# Patient Record
Sex: Female | Born: 1985 | Race: White | Hispanic: No | Smoking: Former smoker
Health system: Southern US, Community
[De-identification: ages and names within clinical notes are randomized; demographics above are authoritative.]

## PROBLEM LIST (undated history)

## (undated) DIAGNOSIS — F32A Depression, unspecified: Secondary | ICD-10-CM

## (undated) DIAGNOSIS — J45909 Unspecified asthma, uncomplicated: Secondary | ICD-10-CM

## (undated) DIAGNOSIS — R112 Nausea with vomiting, unspecified: Secondary | ICD-10-CM

## (undated) DIAGNOSIS — Z9889 Other specified postprocedural states: Secondary | ICD-10-CM

## (undated) DIAGNOSIS — F419 Anxiety disorder, unspecified: Secondary | ICD-10-CM

## (undated) DIAGNOSIS — G43909 Migraine, unspecified, not intractable, without status migrainosus: Secondary | ICD-10-CM

## (undated) DIAGNOSIS — F329 Major depressive disorder, single episode, unspecified: Secondary | ICD-10-CM

## (undated) HISTORY — DX: Other specified postprocedural states: Z98.890

## (undated) HISTORY — DX: Depression, unspecified: F32.A

## (undated) HISTORY — PX: NO PAST SURGERIES: SHX2092

## (undated) HISTORY — PX: TUBAL LIGATION: SHX77

## (undated) HISTORY — DX: Anxiety disorder, unspecified: F41.9

## (undated) HISTORY — DX: Major depressive disorder, single episode, unspecified: F32.9

## (undated) HISTORY — DX: Nausea with vomiting, unspecified: R11.2

---
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
## 2010-07-19 ENCOUNTER — Encounter: Admission: RE | Admit: 2010-07-19 | Discharge: 2010-07-19 | Payer: Self-pay | Admitting: Family Medicine

## 2012-04-11 ENCOUNTER — Other Ambulatory Visit: Payer: Self-pay | Admitting: Neurosurgery

## 2012-04-11 DIAGNOSIS — M542 Cervicalgia: Secondary | ICD-10-CM

## 2012-04-11 DIAGNOSIS — M545 Low back pain: Secondary | ICD-10-CM

## 2012-04-17 ENCOUNTER — Ambulatory Visit
Admission: RE | Admit: 2012-04-17 | Discharge: 2012-04-17 | Disposition: A | Discharge status: Home / Self Care | Payer: BC Managed Care – PPO | Source: Ambulatory Visit | Attending: Neurosurgery | Admitting: Neurosurgery

## 2012-04-17 DIAGNOSIS — M545 Low back pain: Secondary | ICD-10-CM

## 2012-04-17 DIAGNOSIS — M542 Cervicalgia: Secondary | ICD-10-CM

## 2012-05-23 ENCOUNTER — Inpatient Hospital Stay (HOSPITAL_COMMUNITY)
Admission: AD | Admit: 2012-05-23 | Discharge: 2012-05-23 | Disposition: A | Discharge status: Home / Self Care | Payer: Medicaid Other | Source: Ambulatory Visit | Attending: Obstetrics and Gynecology | Admitting: Obstetrics and Gynecology

## 2012-05-23 ENCOUNTER — Other Ambulatory Visit: Payer: Self-pay | Admitting: Obstetrics & Gynecology

## 2012-05-23 DIAGNOSIS — Z298 Encounter for other specified prophylactic measures: Secondary | ICD-10-CM | POA: Insufficient documentation

## 2012-05-23 DIAGNOSIS — Z3480 Encounter for supervision of other normal pregnancy, unspecified trimester: Secondary | ICD-10-CM | POA: Insufficient documentation

## 2012-05-23 DIAGNOSIS — Z2989 Encounter for other specified prophylactic measures: Secondary | ICD-10-CM | POA: Insufficient documentation

## 2012-05-23 MED ORDER — RHO D IMMUNE GLOBULIN 1500 UNIT/2ML IJ SOLN
300.0000 ug | Freq: Once | INTRAMUSCULAR | Status: AC
  Administered 2012-05-23: 300 ug via INTRAMUSCULAR
  Filled 2012-05-23: qty 2

## 2012-05-23 NOTE — MAU Note (Signed)
I spoke with Dr. Juliene Pina regarding orders for this patient who present for Rhophylac and Ultrasound after talking via phone with Dr. Juliene Pina yesterday.  Dr. Juliene Pina states pt only needs Rhophylac and that her BHCG had increased.  Pt will move her care to Dr. Senaida Ores on 05/27/2012 as arranged by Dr. Juliene Pina.  Pt voices she was told she needed [place], 2nd phone call to Dr. Juliene Pina and same plan.  I explained again to pt the significance of increase BHCG as stated by Dr. Juliene Pina.  Pt offered to register into MAU for further exam, however this did not guarantee [place] medically necessary, this would be discussed after evaluation by MAU provider since she would not be registered under Dr. Juliene Pina.  Repeatly told patient she was welcomed to be evaluated in MAU.  Pt elected to go with only Rhophylac per Dr. Juliene Pina plan and will return when injection ready. Phone Number [phone].  Left with husband and child.

## 2012-05-23 NOTE — MAU Note (Signed)
Rhophylac information sheet given to the patient. Will call the office for the order. Patient states she is to have an ultrasound per Dr. Juliene Pina. Explained the 1 1/2 hours required to process this order. Patient understands.

## 2012-05-24 LAB — RH IG WORKUP (INCLUDES ABO/RH)
ABO/RH(D): O NEG
Antibody Screen: NEGATIVE
Gestational Age(Wks): 5
Unit division: 0

## 2012-05-26 LAB — ABO/RH: ABO/RH(D): O NEG

## 2012-05-30 ENCOUNTER — Encounter (HOSPITAL_COMMUNITY): Payer: Self-pay | Admitting: *Deleted

## 2012-05-30 ENCOUNTER — Emergency Department (HOSPITAL_COMMUNITY)
Admission: EM | Admit: 2012-05-30 | Discharge: 2012-05-31 | Disposition: A | Discharge status: Home / Self Care | Payer: Medicaid Other | Attending: Emergency Medicine | Admitting: Emergency Medicine

## 2012-05-30 DIAGNOSIS — Z880 Allergy status to penicillin: Secondary | ICD-10-CM | POA: Insufficient documentation

## 2012-05-30 DIAGNOSIS — R071 Chest pain on breathing: Secondary | ICD-10-CM | POA: Insufficient documentation

## 2012-05-30 DIAGNOSIS — Z79899 Other long term (current) drug therapy: Secondary | ICD-10-CM | POA: Insufficient documentation

## 2012-05-30 DIAGNOSIS — R0789 Other chest pain: Secondary | ICD-10-CM

## 2012-05-30 DIAGNOSIS — O99891 Other specified diseases and conditions complicating pregnancy: Secondary | ICD-10-CM | POA: Insufficient documentation

## 2012-05-30 NOTE — ED Notes (Signed)
Pt states she was leaning over her baby's crib and put a lot of pressure on her R rib. Since then, she has been in a lot of pain.  Tonight, she sneezed, around 5 pm and she heard a pop.  Since then she experiencing sob on inspiration and great pain.

## 2012-05-30 NOTE — ED Notes (Signed)
Prior to registration: pt reports "is pregnant and feels like she separated some ribs", c/o "rib pain, worse with movement and inspiration".

## 2012-05-31 ENCOUNTER — Encounter (HOSPITAL_COMMUNITY): Payer: Self-pay

## 2012-05-31 MED ORDER — ACETAMINOPHEN 325 MG PO TABS
650.0000 mg | ORAL_TABLET | Freq: Once | ORAL | Status: AC
  Administered 2012-05-31: 650 mg via ORAL
  Filled 2012-05-31: qty 2

## 2012-05-31 MED ORDER — ONDANSETRON 8 MG PO TBDP: 8.0000 mg | ORAL_TABLET | Freq: Three times a day (TID) | ORAL | Status: AC | PRN

## 2012-05-31 MED ORDER — OXYCODONE-ACETAMINOPHEN 5-325 MG PO TABS: 1.0000 | ORAL_TABLET | ORAL | Status: AC | PRN

## 2012-05-31 NOTE — ED Notes (Signed)
Discharged via teach back with incentive spirometer.

## 2012-05-31 NOTE — ED Provider Notes (Signed)
History     CSN: [id number]  Arrival date & time 05/30/12  2331   First MD Initiated Contact with Patient 05/31/12 0001      Chief Complaint  Patient presents with  . Muscle Pain    rib pain  . Chest Pain    (Consider location/radiation/quality/duration/timing/severity/associated sxs/prior treatment) HPI Comments: Pt is a 25yo female who presented in ED after she sneezed earlier today and developed pain in the right rib cage. States she heard a "pop."  States pain with movement and breathing. Pt denies shortness of breath, denies bruising or swelling. No abdominal pain. States she is [redacted]wks pregnant, denies problems with pregnancy. States feels baby moving, no vaginal discharge or bleeding.   The history is provided by the patient.    History reviewed. No pertinent past medical history.  History reviewed. No pertinent past surgical history.  No family history on file.  History  Substance Use Topics  . Smoking status: Never Smoker   . Smokeless tobacco: Not on file  . Alcohol Use: No    OB History    Grav Para Term Preterm Abortions TAB SAB Ect Mult Living                  Review of Systems  Constitutional: Negative for fever and chills.  HENT: Negative for neck pain.   Respiratory: Negative for cough, shortness of breath and wheezing.   Cardiovascular: Positive for chest pain. Negative for palpitations and leg swelling.  Gastrointestinal: Negative for nausea, vomiting and abdominal pain.  Genitourinary: Negative for flank pain.  Musculoskeletal: Negative.   Skin: Negative.     Allergies  Penicillins  Home Medications   Current Outpatient Rx  Name Route Sig Dispense Refill  . PRENATAL MULTIVITAMIN CH Oral Take 1 tablet by mouth daily.      BP 130/78  Pulse 79  Temp 98.1 F (36.7 C) (Oral)  SpO2 99%  LMP 04/12/2012  Physical Exam  Nursing note and vitals reviewed. Constitutional: She is oriented to person, place, and time. She appears well-developed  and well-nourished. No distress.  HENT:  Head: Normocephalic.  Eyes: Conjunctivae are normal.  Cardiovascular: Normal rate, regular rhythm and normal heart sounds.   Pulmonary/Chest: Effort normal and breath sounds normal. No respiratory distress. She has no wheezes. She has no rales.       Right ribs normal appearing. Tender to palpation over right midaxillary line over lower ribs. No crepitus.  Abdominal: Soft. Bowel sounds are normal. She exhibits no distension. There is no tenderness.       gravid  Musculoskeletal: Normal range of motion. She exhibits no edema.  Neurological: She is alert and oriented to person, place, and time.    ED Course  Procedures (including critical care time)  Right rib pain after sneezing. Lung sounds normal. Oxygen sat normal. Pt is pregnant and does not want an x-ray done. Will treat with pain medications. Pt instructed that pain medications can affect the baqby as well, but pt states she cannot take the pain any more. WIll treat with tylenol and percocet only for severe pain  1. Chest pain, musculoskeletal       MDM          Lottie Mussel, PA 05/31/12 [phone]

## 2012-05-31 NOTE — Discharge Instructions (Signed)
It is possible that you pulled or strained a muscle in the rib cage, it is also possible you may have fractured a rib. Use incentive spirometer every few hrs at home. Tylenol for pain. Percocet ONLY FOR SEVERE PAIN, this medicatin can affect the baby. Take zofran for nausea if needed. Ice and heating pack to the ribs. Follow up with your doctor as needed.   Rib Fracture Your caregiver has diagnosed you as having a rib fracture (a break). This can occur by a blow to the chest, by a fall against a hard object, or by violent coughing or sneezing. There may be one or many breaks. Rib fractures may heal on their own within 3 to 8 weeks. The longer healing period is usually associated with a continued cough or other aggravating activities. HOME CARE INSTRUCTIONS   Avoid strenuous activity. Be careful during activities and avoid bumping the injured rib. Activities that cause pain pull on the fracture site(s) and are best avoided if possible.   Eat a normal, well-balanced diet. Drink plenty of fluids to avoid constipation.   Take deep breaths several times a day to keep lungs free of infection. Try to cough several times a day, splinting the injured area with a pillow. This will help prevent pneumonia.   Do not wear a rib belt or binder. These restrict breathing which can lead to pneumonia.   Only take over-the-counter or prescription medicines for pain, discomfort, or fever as directed by your caregiver.  SEEK MEDICAL CARE IF:  You develop a continual cough, associated with thick or bloody sputum. SEEK IMMEDIATE MEDICAL CARE IF:   You have a fever.   You have difficulty breathing.   You have nausea (feeling sick to your stomach), vomiting, or abdominal (belly) pain.   You have worsening pain, not controlled with medications.  Document Released: 12/03/2005 Document Revised: 11/22/2011 Document Reviewed: 05/07/2007 [facility] Patient Information 2012 [place], [place].

## 2012-06-01 NOTE — ED Provider Notes (Signed)
Medical screening examination/treatment/procedure(s) were performed by non-physician practitioner and as supervising physician I was immediately available for consultation/collaboration.  Senaya Dicenso, MD 06/01/12 1908 

## 2012-06-12 LAB — OB RESULTS CONSOLE GC/CHLAMYDIA
Chlamydia: NEGATIVE
Gonorrhea: NEGATIVE

## 2012-06-12 LAB — OB RESULTS CONSOLE ABO/RH: RH Type: NEGATIVE

## 2012-06-12 LAB — OB RESULTS CONSOLE RUBELLA ANTIBODY, IGM: Rubella: IMMUNE

## 2012-06-12 LAB — OB RESULTS CONSOLE HIV ANTIBODY (ROUTINE TESTING): HIV: NONREACTIVE

## 2012-06-12 LAB — OB RESULTS CONSOLE RPR: RPR: NONREACTIVE

## 2012-06-12 LAB — OB RESULTS CONSOLE HEPATITIS B SURFACE ANTIGEN: Hepatitis B Surface Ag: NEGATIVE

## 2012-12-25 LAB — OB RESULTS CONSOLE GBS: GBS: NEGATIVE

## 2013-01-21 ENCOUNTER — Encounter (HOSPITAL_COMMUNITY): Payer: Self-pay | Admitting: *Deleted

## 2013-01-21 ENCOUNTER — Telehealth (HOSPITAL_COMMUNITY): Payer: Self-pay | Admitting: *Deleted

## 2013-01-21 NOTE — Telephone Encounter (Signed)
Preadmission screen  

## 2013-01-25 ENCOUNTER — Encounter (HOSPITAL_COMMUNITY): Payer: Self-pay | Admitting: *Deleted

## 2013-01-25 ENCOUNTER — Encounter (HOSPITAL_COMMUNITY): Payer: Self-pay

## 2013-01-25 ENCOUNTER — Inpatient Hospital Stay (HOSPITAL_COMMUNITY)
Admission: AD | Admit: 2013-01-25 | Discharge: 2013-01-25 | Disposition: A | Discharge status: Home / Self Care | Payer: BC Managed Care – PPO | Source: Ambulatory Visit | Attending: Obstetrics and Gynecology | Admitting: Obstetrics and Gynecology

## 2013-01-25 ENCOUNTER — Inpatient Hospital Stay (HOSPITAL_COMMUNITY)
Admission: AD | Admit: 2013-01-25 | Discharge: 2013-01-28 | DRG: 371 | Disposition: A | Discharge status: Home / Self Care | Payer: BC Managed Care – PPO | Source: Ambulatory Visit | Attending: Obstetrics and Gynecology | Admitting: Obstetrics and Gynecology

## 2013-01-25 DIAGNOSIS — O479 False labor, unspecified: Secondary | ICD-10-CM | POA: Insufficient documentation

## 2013-01-25 DIAGNOSIS — IMO0001 Reserved for inherently not codable concepts without codable children: Secondary | ICD-10-CM

## 2013-01-25 DIAGNOSIS — Z98891 History of uterine scar from previous surgery: Secondary | ICD-10-CM

## 2013-01-25 DIAGNOSIS — O324XX0 Maternal care for high head at term, not applicable or unspecified: Secondary | ICD-10-CM | POA: Diagnosis present

## 2013-01-25 LAB — CBC
MCH: 29.5 pg (ref 26.0–34.0)
MCHC: 33.9 g/dL (ref 30.0–36.0)
RDW: 13.6 % (ref 11.5–15.5)

## 2013-01-25 MED ORDER — LACTATED RINGERS IV SOLN
500.0000 mL | INTRAVENOUS | Status: DC | PRN
  Administered 2013-01-25: 1000 mL via INTRAVENOUS
  Administered 2013-01-26 (×2): 500 mL via INTRAVENOUS

## 2013-01-25 MED ORDER — OXYCODONE-ACETAMINOPHEN 5-325 MG PO TABS: 1.0000 | ORAL_TABLET | ORAL | Status: DC | PRN

## 2013-01-25 MED ORDER — FLEET ENEMA 7-19 GM/118ML RE ENEM: 1.0000 | ENEMA | RECTAL | Status: DC | PRN

## 2013-01-25 MED ORDER — OXYTOCIN 40 UNITS IN LACTATED RINGERS INFUSION - SIMPLE MED
62.5000 mL/h | INTRAVENOUS | Status: DC
  Filled 2013-01-25: qty 1000

## 2013-01-25 MED ORDER — EPHEDRINE 5 MG/ML INJ
10.0000 mg | INTRAVENOUS | Status: DC | PRN
  Filled 2013-01-25: qty 4

## 2013-01-25 MED ORDER — LACTATED RINGERS IV SOLN
500.0000 mL | Freq: Once | INTRAVENOUS | Status: AC
  Administered 2013-01-25: 500 mL via INTRAVENOUS

## 2013-01-25 MED ORDER — LIDOCAINE HCL (PF) 1 % IJ SOLN
INTRAMUSCULAR | Status: DC | PRN
  Administered 2013-01-25 (×2): 8 mL

## 2013-01-25 MED ORDER — CITRIC ACID-SODIUM CITRATE 334-500 MG/5ML PO SOLN: 30.0000 mL | ORAL | Status: DC | PRN

## 2013-01-25 MED ORDER — EPHEDRINE 5 MG/ML INJ
10.0000 mg | INTRAVENOUS | Status: DC | PRN
  Administered 2013-01-25: 10 mg via INTRAVENOUS

## 2013-01-25 MED ORDER — OXYTOCIN BOLUS FROM INFUSION: 500.0000 mL | INTRAVENOUS | Status: DC

## 2013-01-25 MED ORDER — IBUPROFEN 600 MG PO TABS: 600.0000 mg | ORAL_TABLET | Freq: Four times a day (QID) | ORAL | Status: DC | PRN

## 2013-01-25 MED ORDER — ONDANSETRON HCL 4 MG/2ML IJ SOLN: 4.0000 mg | Freq: Four times a day (QID) | INTRAMUSCULAR | Status: DC | PRN

## 2013-01-25 MED ORDER — FENTANYL 2.5 MCG/ML BUPIVACAINE 1/10 % EPIDURAL INFUSION (WH - ANES)
INTRAMUSCULAR | Status: DC | PRN
  Administered 2013-01-25: 14 mL/h via EPIDURAL

## 2013-01-25 MED ORDER — PHENYLEPHRINE 40 MCG/ML (10ML) SYRINGE FOR IV PUSH (FOR BLOOD PRESSURE SUPPORT): 80.0000 ug | PREFILLED_SYRINGE | INTRAVENOUS | Status: DC | PRN

## 2013-01-25 MED ORDER — PHENYLEPHRINE 40 MCG/ML (10ML) SYRINGE FOR IV PUSH (FOR BLOOD PRESSURE SUPPORT)
80.0000 ug | PREFILLED_SYRINGE | INTRAVENOUS | Status: DC | PRN
  Filled 2013-01-25: qty 5

## 2013-01-25 MED ORDER — LACTATED RINGERS IV SOLN
INTRAVENOUS | Status: DC
  Administered 2013-01-26 (×2): via INTRAVENOUS

## 2013-01-25 MED ORDER — FENTANYL 2.5 MCG/ML BUPIVACAINE 1/10 % EPIDURAL INFUSION (WH - ANES)
14.0000 mL/h | INTRAMUSCULAR | Status: DC
  Administered 2013-01-26: 14 mL/h via EPIDURAL
  Filled 2013-01-25 (×2): qty 125

## 2013-01-25 MED ORDER — LIDOCAINE HCL (PF) 1 % IJ SOLN
30.0000 mL | INTRAMUSCULAR | Status: DC | PRN
  Filled 2013-01-25: qty 30

## 2013-01-25 MED ORDER — DIPHENHYDRAMINE HCL 50 MG/ML IJ SOLN: 12.5000 mg | INTRAMUSCULAR | Status: DC | PRN

## 2013-01-25 MED ORDER — ACETAMINOPHEN 325 MG PO TABS: 650.0000 mg | ORAL_TABLET | ORAL | Status: DC | PRN

## 2013-01-25 NOTE — Progress Notes (Signed)
Comfortable with epidural VE- 8/80/-1, vtx AROM clear Monitor progress

## 2013-01-25 NOTE — Anesthesia Procedure Notes (Signed)
Epidural Patient location during procedure: OB Start time: 01/25/2013 9:35 PM End time: 01/25/2013 9:45 PM  Staffing Anesthesiologist: Sandrea Hughs Performed by: anesthesiologist   Preanesthetic Checklist Completed: patient identified, site marked, surgical consent, pre-op evaluation, timeout performed, IV checked, risks and benefits discussed and monitors and equipment checked  Epidural Patient position: sitting Prep: site prepped and draped and DuraPrep Patient monitoring: continuous pulse ox and blood pressure Approach: midline Injection technique: LOR air  Needle:  Needle type: Tuohy  Needle gauge: 17 G Needle length: 9 cm and 9 Needle insertion depth: 6 cm Catheter type: closed end flexible Catheter size: 19 Gauge Catheter at skin depth: 11 cm Test dose: negative and Other  Assessment Sensory level: T12 Events: blood not aspirated, injection not painful, no injection resistance, negative IV test and no paresthesia  Additional Notes Reason for block:procedure for pain

## 2013-01-25 NOTE — MAU Note (Signed)
Contractions every 5 minutes

## 2013-01-25 NOTE — Treatment Plan (Signed)
Dr Jackelyn Knife notified about patients status and chief complaint. Pt may walk x 1 hour

## 2013-01-25 NOTE — H&P (Signed)
Maria Lin is a 27 y.o. female, G2 P1001, EGA 40+ weeks with EDC 2-6 presenting for evaluation of reg ctx.  She was here earlier this am for ctx, VE 2 cm and did not change.  Current eval with reg ctx, VE 7-8 cm.  Prenatal care essentially uncomplicated, first delivery apparently was traumatic and baby had seizures.  See prenatal records for complete history.  Maternal Medical History:  Reason for admission: Contractions.   Contractions: Frequency: regular.   Perceived severity is strong.    Fetal activity: Perceived fetal activity is normal.      OB History   Grav Para Term Preterm Abortions TAB SAB Ect Mult Living   2 1 1       1     SVD at term, 6 lbs 9 oz, "traumatic" delivery, baby with seizures  Past Medical History  Diagnosis Date  . PONV (postoperative nausea and vomiting)     with labor   . Depression     hx depression and pp depression  Migraines  Past Surgical History  Procedure Laterality Date  . No past surgeries     Family History: family history includes Cancer in her maternal aunt, maternal grandfather, and maternal grandmother; Heart attack in her paternal grandfather; Heart disease in her paternal grandfather; Hypertension in her maternal aunt; Other in her father; and Stroke in her paternal grandfather. Social History:  reports that she has never smoked. She has never used smokeless tobacco. She reports that she does not drink alcohol or use illicit drugs.   Prenatal Transfer Tool  Maternal Diabetes: No Genetic Screening: Declined Maternal Ultrasounds/Referrals: Normal Fetal Ultrasounds or other Referrals:  None Maternal Substance Abuse:  No Significant Maternal Medications:  None Significant Maternal Lab Results:  Lab values include: Group B Strep negative, Rh negative Other Comments:  None  Review of Systems  Respiratory: Negative.   Cardiovascular: Negative.     Dilation: 7.5 Effacement (%): 100 Station:  (BBOW) Exam by:: Briant Cedar  RN Blood pressure 125/100, pulse 107, temperature 98.3 F (36.8 C), temperature source Oral, resp. rate 18, height 5\' 3"  (1.6 m), weight 90.266 kg (199 lb), last menstrual period 04/12/2012. Maternal Exam:  Uterine Assessment: Contraction strength is moderate.  Contraction frequency is regular.   Abdomen: Patient reports no abdominal tenderness. Estimated fetal weight is 7 lbs.   Fetal presentation: vertex  Introitus: Normal vulva. Normal vagina.  Pelvis: adequate for delivery.   Cervix: Cervix evaluated by digital exam.     Fetal Exam Fetal Monitor Review: Mode: ultrasound.   Baseline rate: 130.  Variability: moderate (6-25 bpm).   Pattern: accelerations present and no decelerations.    Fetal State Assessment: Category I - tracings are normal.     Physical Exam  Constitutional: She appears well-developed and well-nourished.  Cardiovascular: Normal rate and normal heart sounds.   No murmur heard. Respiratory: Effort normal. She has no wheezes.  GI: Soft.  Gravid     Prenatal labs: ABO, Rh: O/Negative/-- (06/27 0000) Antibody: NEG (06/07 1253) Rubella: Immune (06/27 0000) RPR: Nonreactive (06/27 0000)  HBsAg: Negative (06/27 0000)  HIV: Non-reactive (06/27 0000)  GBS: Negative (01/09 0000)  GCT:  Nl  Assessment/Plan: IUP at 40+ weeks in active labor.  Will get epidural, then AROM and anticipate SVD.   Maria Lin D 01/25/2013, 9:18 PM

## 2013-01-25 NOTE — Anesthesia Preprocedure Evaluation (Signed)
Anesthesia Evaluation  Patient identified by MRN, date of birth, ID band Patient awake    Reviewed: Allergy & Precautions, H&P , NPO status , Patient's Chart, lab work & pertinent test results  Airway Mallampati: II TM Distance: >3 FB Neck ROM: full    Dental no notable dental hx.    Pulmonary neg pulmonary ROS,    Pulmonary exam normal       Cardiovascular negative cardio ROS      Neuro/Psych negative neurological ROS     GI/Hepatic negative GI ROS, Neg liver ROS,   Endo/Other  negative endocrine ROS  Renal/GU negative Renal ROS  negative genitourinary   Musculoskeletal negative musculoskeletal ROS (+)   Abdominal Normal abdominal exam  (+)   Peds negative pediatric ROS (+)  Hematology negative hematology ROS (+)   Anesthesia Other Findings   Reproductive/Obstetrics (+) Pregnancy                           Anesthesia Physical Anesthesia Plan  ASA: II  Anesthesia Plan: Epidural   Post-op Pain Management:    Induction:   Airway Management Planned:   Additional Equipment:   Intra-op Plan:   Post-operative Plan:   Informed Consent: I have reviewed the patients History and Physical, chart, labs and discussed the procedure including the risks, benefits and alternatives for the proposed anesthesia with the patient or authorized representative who has indicated his/her understanding and acceptance.     Plan Discussed with:   Anesthesia Plan Comments:         Anesthesia Quick Evaluation  

## 2013-01-26 ENCOUNTER — Encounter (HOSPITAL_COMMUNITY): Payer: Self-pay | Admitting: Anesthesiology

## 2013-01-26 ENCOUNTER — Encounter (HOSPITAL_COMMUNITY)
Admission: AD | Disposition: A | Discharge status: Home / Self Care | Payer: Self-pay | Source: Ambulatory Visit | Attending: Obstetrics and Gynecology

## 2013-01-26 ENCOUNTER — Inpatient Hospital Stay (HOSPITAL_COMMUNITY): Payer: BC Managed Care – PPO | Admitting: Anesthesiology

## 2013-01-26 ENCOUNTER — Encounter (HOSPITAL_COMMUNITY): Payer: Self-pay | Admitting: Registered Nurse

## 2013-01-26 DIAGNOSIS — Z98891 History of uterine scar from previous surgery: Secondary | ICD-10-CM

## 2013-01-26 LAB — RPR: RPR Ser Ql: NONREACTIVE

## 2013-01-26 SURGERY — Surgical Case
Anesthesia: Epidural | Site: Abdomen | Wound class: Clean Contaminated

## 2013-01-26 MED ORDER — ONDANSETRON HCL 4 MG/2ML IJ SOLN: 4.0000 mg | INTRAMUSCULAR | Status: DC | PRN

## 2013-01-26 MED ORDER — LACTATED RINGERS IV SOLN
INTRAVENOUS | Status: DC | PRN
  Administered 2013-01-26: 06:00:00 via INTRAVENOUS

## 2013-01-26 MED ORDER — METOCLOPRAMIDE HCL 5 MG/ML IJ SOLN: 10.0000 mg | Freq: Three times a day (TID) | INTRAMUSCULAR | Status: DC | PRN

## 2013-01-26 MED ORDER — NALOXONE HCL 1 MG/ML IJ SOLN
1.0000 ug/kg/h | INTRAVENOUS | Status: DC | PRN
  Filled 2013-01-26: qty 2

## 2013-01-26 MED ORDER — NALOXONE HCL 0.4 MG/ML IJ SOLN: 0.4000 mg | INTRAMUSCULAR | Status: DC | PRN

## 2013-01-26 MED ORDER — CEFAZOLIN SODIUM-DEXTROSE 2-3 GM-% IV SOLR
INTRAVENOUS | Status: DC | PRN
  Administered 2013-01-26: 2 g via INTRAVENOUS

## 2013-01-26 MED ORDER — MORPHINE SULFATE 0.5 MG/ML IJ SOLN
INTRAMUSCULAR | Status: AC
  Filled 2013-01-26: qty 10

## 2013-01-26 MED ORDER — SCOPOLAMINE 1 MG/3DAYS TD PT72
MEDICATED_PATCH | TRANSDERMAL | Status: AC
  Filled 2013-01-26: qty 1

## 2013-01-26 MED ORDER — OXYTOCIN 10 UNIT/ML IJ SOLN
INTRAMUSCULAR | Status: AC
  Filled 2013-01-26: qty 4

## 2013-01-26 MED ORDER — OXYTOCIN 40 UNITS IN LACTATED RINGERS INFUSION - SIMPLE MED: 62.5000 mL/h | INTRAVENOUS | Status: AC

## 2013-01-26 MED ORDER — LIDOCAINE-EPINEPHRINE (PF) 2 %-1:200000 IJ SOLN
INTRAMUSCULAR | Status: AC
  Filled 2013-01-26: qty 20

## 2013-01-26 MED ORDER — PHENYLEPHRINE 40 MCG/ML (10ML) SYRINGE FOR IV PUSH (FOR BLOOD PRESSURE SUPPORT)
PREFILLED_SYRINGE | INTRAVENOUS | Status: AC
  Filled 2013-01-26: qty 5

## 2013-01-26 MED ORDER — PRENATAL MULTIVITAMIN CH
1.0000 | ORAL_TABLET | Freq: Every day | ORAL | Status: DC
  Administered 2013-01-27 – 2013-01-28 (×2): 1 via ORAL
  Filled 2013-01-26 (×2): qty 1

## 2013-01-26 MED ORDER — SCOPOLAMINE 1 MG/3DAYS TD PT72
1.0000 | MEDICATED_PATCH | Freq: Once | TRANSDERMAL | Status: DC
  Administered 2013-01-26: 1.5 mg via TRANSDERMAL

## 2013-01-26 MED ORDER — MENTHOL 3 MG MT LOZG: 1.0000 | LOZENGE | OROMUCOSAL | Status: DC | PRN

## 2013-01-26 MED ORDER — WITCH HAZEL-GLYCERIN EX PADS: 1.0000 "application " | MEDICATED_PAD | CUTANEOUS | Status: DC | PRN

## 2013-01-26 MED ORDER — SODIUM BICARBONATE 8.4 % IV SOLN
INTRAVENOUS | Status: DC | PRN
  Administered 2013-01-26: 5 mL via EPIDURAL

## 2013-01-26 MED ORDER — LACTATED RINGERS IV SOLN
INTRAVENOUS | Status: DC | PRN
  Administered 2013-01-26 (×2): via INTRAVENOUS

## 2013-01-26 MED ORDER — SODIUM CHLORIDE 0.9 % IJ SOLN: 3.0000 mL | INTRAMUSCULAR | Status: DC | PRN

## 2013-01-26 MED ORDER — MEPERIDINE HCL 25 MG/ML IJ SOLN: 6.2500 mg | INTRAMUSCULAR | Status: DC | PRN

## 2013-01-26 MED ORDER — SIMETHICONE 80 MG PO CHEW
80.0000 mg | CHEWABLE_TABLET | Freq: Three times a day (TID) | ORAL | Status: DC
  Administered 2013-01-26 – 2013-01-28 (×4): 80 mg via ORAL

## 2013-01-26 MED ORDER — TETANUS-DIPHTH-ACELL PERTUSSIS 5-2.5-18.5 LF-MCG/0.5 IM SUSP: 0.5000 mL | Freq: Once | INTRAMUSCULAR | Status: DC

## 2013-01-26 MED ORDER — MEPERIDINE HCL 25 MG/ML IJ SOLN
INTRAMUSCULAR | Status: AC
  Filled 2013-01-26: qty 1

## 2013-01-26 MED ORDER — DIPHENHYDRAMINE HCL 50 MG/ML IJ SOLN
12.5000 mg | INTRAMUSCULAR | Status: DC | PRN
  Administered 2013-01-26: 12.5 mg via INTRAVENOUS

## 2013-01-26 MED ORDER — KETOROLAC TROMETHAMINE 30 MG/ML IJ SOLN
INTRAMUSCULAR | Status: AC
  Administered 2013-01-26: 30 mg via INTRAVENOUS
  Filled 2013-01-26: qty 1

## 2013-01-26 MED ORDER — DIPHENHYDRAMINE HCL 25 MG PO CAPS: 25.0000 mg | ORAL_CAPSULE | Freq: Four times a day (QID) | ORAL | Status: DC | PRN

## 2013-01-26 MED ORDER — DIBUCAINE 1 % RE OINT: 1.0000 "application " | TOPICAL_OINTMENT | RECTAL | Status: DC | PRN

## 2013-01-26 MED ORDER — SENNOSIDES-DOCUSATE SODIUM 8.6-50 MG PO TABS
2.0000 | ORAL_TABLET | Freq: Every day | ORAL | Status: DC
  Administered 2013-01-26 – 2013-01-27 (×2): 2 via ORAL

## 2013-01-26 MED ORDER — HYDROMORPHONE HCL 2 MG PO TABS
2.0000 mg | ORAL_TABLET | ORAL | Status: DC | PRN
  Administered 2013-01-27 – 2013-01-28 (×3): 2 mg via ORAL
  Filled 2013-01-26 (×3): qty 1

## 2013-01-26 MED ORDER — DIPHENHYDRAMINE HCL 50 MG/ML IJ SOLN: 25.0000 mg | INTRAMUSCULAR | Status: DC | PRN

## 2013-01-26 MED ORDER — PHENYLEPHRINE HCL 10 MG/ML IJ SOLN
INTRAMUSCULAR | Status: DC | PRN
  Administered 2013-01-26 (×2): 80 ug via INTRAVENOUS
  Administered 2013-01-26: 40 ug via INTRAVENOUS
  Administered 2013-01-26: 80 ug via INTRAVENOUS

## 2013-01-26 MED ORDER — CITRIC ACID-SODIUM CITRATE 334-500 MG/5ML PO SOLN
ORAL | Status: AC
  Administered 2013-01-26: 30 mL
  Filled 2013-01-26: qty 15

## 2013-01-26 MED ORDER — SODIUM BICARBONATE 8.4 % IV SOLN
INTRAVENOUS | Status: AC
  Filled 2013-01-26: qty 50

## 2013-01-26 MED ORDER — ONDANSETRON HCL 4 MG/2ML IJ SOLN
INTRAMUSCULAR | Status: DC | PRN
  Administered 2013-01-26: 4 mg via INTRAVENOUS

## 2013-01-26 MED ORDER — LACTATED RINGERS IV SOLN
INTRAVENOUS | Status: DC
  Administered 2013-01-26: via INTRAVENOUS

## 2013-01-26 MED ORDER — KETOROLAC TROMETHAMINE 30 MG/ML IJ SOLN
15.0000 mg | Freq: Once | INTRAMUSCULAR | Status: AC | PRN
  Administered 2013-01-26: 30 mg via INTRAVENOUS

## 2013-01-26 MED ORDER — OXYTOCIN 10 UNIT/ML IJ SOLN
40.0000 [IU] | INTRAVENOUS | Status: DC | PRN
  Administered 2013-01-26: 40 [IU] via INTRAVENOUS

## 2013-01-26 MED ORDER — ONDANSETRON HCL 4 MG PO TABS: 4.0000 mg | ORAL_TABLET | ORAL | Status: DC | PRN

## 2013-01-26 MED ORDER — DIPHENHYDRAMINE HCL 25 MG PO CAPS: 25.0000 mg | ORAL_CAPSULE | ORAL | Status: DC | PRN

## 2013-01-26 MED ORDER — 0.9 % SODIUM CHLORIDE (POUR BTL) OPTIME
TOPICAL | Status: DC | PRN
  Administered 2013-01-26 (×2): 200 mL

## 2013-01-26 MED ORDER — MEASLES, MUMPS & RUBELLA VAC ~~LOC~~ INJ
0.5000 mL | INJECTION | Freq: Once | SUBCUTANEOUS | Status: DC
  Filled 2013-01-26: qty 0.5

## 2013-01-26 MED ORDER — ONDANSETRON HCL 4 MG/2ML IJ SOLN: 4.0000 mg | Freq: Three times a day (TID) | INTRAMUSCULAR | Status: DC | PRN

## 2013-01-26 MED ORDER — TERBUTALINE SULFATE 1 MG/ML IJ SOLN: 0.2500 mg | Freq: Once | INTRAMUSCULAR | Status: DC | PRN

## 2013-01-26 MED ORDER — MEPERIDINE HCL 25 MG/ML IJ SOLN
INTRAMUSCULAR | Status: DC | PRN
  Administered 2013-01-26: 25 mg via INTRAVENOUS

## 2013-01-26 MED ORDER — IBUPROFEN 600 MG PO TABS
600.0000 mg | ORAL_TABLET | Freq: Four times a day (QID) | ORAL | Status: DC
  Administered 2013-01-26 – 2013-01-28 (×6): 600 mg via ORAL
  Filled 2013-01-26 (×6): qty 1

## 2013-01-26 MED ORDER — OXYTOCIN 40 UNITS IN LACTATED RINGERS INFUSION - SIMPLE MED
1.0000 m[IU]/min | INTRAVENOUS | Status: DC
  Administered 2013-01-26: 2 m[IU]/min via INTRAVENOUS
  Administered 2013-01-26: 1 m[IU]/min via INTRAVENOUS
  Administered 2013-01-26: 3 m[IU]/min via INTRAVENOUS

## 2013-01-26 MED ORDER — PROMETHAZINE HCL 25 MG/ML IJ SOLN: 6.2500 mg | INTRAMUSCULAR | Status: DC | PRN

## 2013-01-26 MED ORDER — MAGNESIUM HYDROXIDE 400 MG/5ML PO SUSP: 30.0000 mL | ORAL | Status: DC | PRN

## 2013-01-26 MED ORDER — ONDANSETRON HCL 4 MG/2ML IJ SOLN
INTRAMUSCULAR | Status: AC
  Filled 2013-01-26: qty 2

## 2013-01-26 MED ORDER — FENTANYL CITRATE 0.05 MG/ML IJ SOLN: 25.0000 ug | INTRAMUSCULAR | Status: DC | PRN

## 2013-01-26 MED ORDER — SIMETHICONE 80 MG PO CHEW: 80.0000 mg | CHEWABLE_TABLET | ORAL | Status: DC | PRN

## 2013-01-26 MED ORDER — KETOROLAC TROMETHAMINE 60 MG/2ML IM SOLN
60.0000 mg | Freq: Once | INTRAMUSCULAR | Status: AC | PRN
  Filled 2013-01-26: qty 2

## 2013-01-26 MED ORDER — LACTATED RINGERS IV SOLN
INTRAVENOUS | Status: DC
  Administered 2013-01-26: 08:00:00 via INTRAVENOUS

## 2013-01-26 MED ORDER — ZOLPIDEM TARTRATE 5 MG PO TABS: 5.0000 mg | ORAL_TABLET | Freq: Every evening | ORAL | Status: DC | PRN

## 2013-01-26 MED ORDER — MORPHINE SULFATE (PF) 0.5 MG/ML IJ SOLN
INTRAMUSCULAR | Status: DC | PRN
  Administered 2013-01-26: 4 mg via EPIDURAL
  Administered 2013-01-26: 1 mg via INTRAVENOUS

## 2013-01-26 MED ORDER — LANOLIN HYDROUS EX OINT: 1.0000 "application " | TOPICAL_OINTMENT | CUTANEOUS | Status: DC | PRN

## 2013-01-26 MED ORDER — CEFAZOLIN SODIUM-DEXTROSE 2-3 GM-% IV SOLR
INTRAVENOUS | Status: AC
  Filled 2013-01-26: qty 50

## 2013-01-26 SURGICAL SUPPLY — 32 items
CLOTH BEACON ORANGE TIMEOUT ST (SAFETY) ×2 IMPLANT
CONTAINER PREFILL 10% NBF 15ML (MISCELLANEOUS) IMPLANT
DRAPE LG THREE QUARTER DISP (DRAPES) ×2 IMPLANT
DRSG OPSITE POSTOP 4X10 (GAUZE/BANDAGES/DRESSINGS) ×2 IMPLANT
DURAPREP 26ML APPLICATOR (WOUND CARE) ×2 IMPLANT
ELECT REM PT RETURN 9FT ADLT (ELECTROSURGICAL) ×2
ELECTRODE REM PT RTRN 9FT ADLT (ELECTROSURGICAL) ×1 IMPLANT
EXTRACTOR VACUUM KIWI (MISCELLANEOUS) IMPLANT
EXTRACTOR VACUUM M CUP 4 TUBE (SUCTIONS) IMPLANT
GLOVE BIO SURGEON STRL SZ8 (GLOVE) ×2 IMPLANT
GLOVE ORTHO TXT STRL SZ7.5 (GLOVE) ×2 IMPLANT
GOWN PREVENTION PLUS LG XLONG (DISPOSABLE) ×4 IMPLANT
KIT ABG SYR 3ML LUER SLIP (SYRINGE) ×1 IMPLANT
NDL HYPO 25X5/8 SAFETYGLIDE (NEEDLE) ×1 IMPLANT
NEEDLE HYPO 25X5/8 SAFETYGLIDE (NEEDLE) ×2 IMPLANT
NS IRRIG 1000ML POUR BTL (IV SOLUTION) ×2 IMPLANT
PACK C SECTION WH (CUSTOM PROCEDURE TRAY) ×2 IMPLANT
PAD OB MATERNITY 4.3X12.25 (PERSONAL CARE ITEMS) ×2 IMPLANT
RTRCTR C-SECT PINK 25CM LRG (MISCELLANEOUS) ×2 IMPLANT
SLEEVE SCD COMPRESS KNEE MED (MISCELLANEOUS) ×1 IMPLANT
STAPLER VISISTAT 35W (STAPLE) ×1 IMPLANT
SUT CHROMIC 1 CTX 36 (SUTURE) ×5 IMPLANT
SUT PLAIN 0 NONE (SUTURE) IMPLANT
SUT PLAIN 2 0 XLH (SUTURE) ×1 IMPLANT
SUT VIC AB 0 CT1 27 (SUTURE) ×6
SUT VIC AB 0 CT1 27XBRD ANBCTR (SUTURE) ×2 IMPLANT
SUT VIC AB 3-0 SH 27 (SUTURE) ×2
SUT VIC AB 3-0 SH 27X BRD (SUTURE) IMPLANT
SUT VIC AB 4-0 KS 27 (SUTURE) IMPLANT
TOWEL OR 17X24 6PK STRL BLUE (TOWEL DISPOSABLE) ×6 IMPLANT
TRAY FOLEY CATH 14FR (SET/KITS/TRAYS/PACK) ×1 IMPLANT
WATER STERILE IRR 1000ML POUR (IV SOLUTION) ×2 IMPLANT

## 2013-01-26 NOTE — Preoperative (Signed)
Beta Blockers   Reason not to administer Beta Blockers:Not Applicable 

## 2013-01-26 NOTE — Progress Notes (Signed)
After AROM, pt had protracted labor, started on pitocin augmentation.  She eventually reached complete and started pushing at a little before 0400.  I examined her at 0450, C/C/0, minimal movement with pushing, I think position is LOA but asynclitic.  FHT reassuring.  I discussed options, including c-section as baby not down far enough to try assisted delivery.  Pt opted to rest and change position for 15 minutes, then try pushing again.  Will recheck again at 0530 and again discuss options.

## 2013-01-26 NOTE — Plan of Care (Signed)
Problem: Phase II Progression Outcomes Goal: Initiate breastfeeding within 1hr delivery Outcome: Not Applicable Date Met:  01/26/13 Pt prefers not to breastfeed

## 2013-01-26 NOTE — OR Nursing (Signed)
Called Dr Rodman Pickle regarding patient's bp 76/60 - asymptomatic, verbal order to give 500cc LR bolus and recheck bp.  Verified order

## 2013-01-26 NOTE — Consult Note (Signed)
Neonatology Note:  Attendance at C-section:  I was asked to attend this primary C/S at term due to FTP and NRFHR. The mother is a G2P1 O neg, GBS neg with an uncomplicated pregnancy. ROM 8 hours prior to delivery, fluid clear. Infant vigorous with good spontaneous cry and tone. Needed only minimal bulb suctioning. Ap 9/9. Lungs clear to ausc in DR. To CN to care of Pediatrician.  Corlette Ciano, MD  

## 2013-01-26 NOTE — Op Note (Signed)
Preoperative diagnosis: Intrauterine pregnancy at 40 weeks, arrest of descent Postoperative diagnosis: Same Procedure: Primary low transverse cesarean section without extensions Surgeon: Lavina Hamman M.D. Anesthesia: Epidural Findings: Patient had normal gravid anatomy and delivered a viable female infant with Apgars of 9 and 9 weight pending, arterial cord pH 7.22 Estimated blood loss: 1000 cc Specimens: Placenta sent to labor and delivery Complications: None  Procedure in detail: The patient was taken to the operating room and placed in the dorsosupine position. Her previously placed epidural was dosed appropriately. Abdomen was then prepped and draped in the usual sterile fashion, a foley catheter had previously been inserted. The level of her anesthesia was found to be adequate. Abdomen was entered via a standard Pfannenstiel incision. Once the peritoneal cavity was entered the Alexis disposable self-retaining retractor was placed and good visualization was achieved. A 4 cm transverse incision was then made in the lower uterine segment pushing the bladder inferior. Once the uterine cavity was entered the incision was extended digitally. The fetal vertex was grasped and delivered through the incision atraumatically. Mouth and nares were suctioned. The remainder of the infant then delivered atraumatically. Cord was doubly clamped and cut and the infant handed to the awaiting pediatric team. Cord blood was obtained. The placenta delivered spontaneously. Uterus was wiped dry with clean lap pad and all clots and debris were removed. Uterine incision was inspected and found to be free of extensions. Uterine incision was closed in 2 layers with running locking #1 Chromic for the first layer, a second imbricating layer also with #1 Chromic.  Bleeding from the right angle controlled with #1 Chromic and 3-0 Vicryl. Tubes and ovaries were inspected and found to be normal. Uterine incision was inspected and  found to be hemostatic. Bleeding from serosal edges was controlled with electrocautery. The Alexis retractor was removed. Subfascial space was irrigated and made hemostatic with electrocautery. Fascia was closed in running fashion starting at both ends and meeting in the middle with 0 Vicryl. Subcutaneous tissue was then irrigated and made hemostatic with electrocautery, then closed with running 2-0 plain gut. Skin was closed with staples followed by a sterile dressing. Patient tolerated the procedure well and was taken to the recovery in stable condition. Counts were correct x2, she received Ancef 2 g IV at the beginning of the procedure and she had PAS hose on throughout the procedure.

## 2013-01-26 NOTE — Anesthesia Postprocedure Evaluation (Signed)
  Anesthesia Post-op Note  Patient: Maria Lin  Procedure(s) Performed: Procedure(s): CESAREAN SECTION of baby girl   at 0613  APGAR 9/9 (N/A)  Patient Location: Mother/Baby  Anesthesia Type:Spinal  Level of Consciousness: awake, alert  and oriented  Airway and Oxygen Therapy: Patient Spontanous Breathing  Post-op Pain: none  Post-op Assessment: Post-op Vital signs reviewed  Post-op Vital Signs: Reviewed and stable  Complications: No apparent anesthesia complications 

## 2013-01-26 NOTE — Anesthesia Postprocedure Evaluation (Signed)
  Anesthesia Post-op Note  Patient: Maria Lin  Procedure(s) Performed: Procedure(s): CESAREAN SECTION of baby girl   at [phone]  APGAR 9/9 (N/A)  Patient Location: Mother/Baby  Anesthesia Type:Spinal  Level of Consciousness: awake, alert  and oriented  Airway and Oxygen Therapy: Patient Spontanous Breathing  Post-op Pain: none  Post-op Assessment: Post-op Vital signs reviewed  Post-op Vital Signs: Reviewed and stable  Complications: No apparent anesthesia complications

## 2013-01-26 NOTE — Anesthesia Postprocedure Evaluation (Signed)
Anesthesia Post Note  Patient: Maria Lin  Procedure(s) Performed: Procedure(s) (LRB): CESAREAN SECTION of baby girl   at [phone]  APGAR 9/9 (N/A)  Anesthesia type: Epidural  Patient location: PACU  Post pain: Pain level controlled  Post assessment: Post-op Vital signs reviewed  Last Vitals:  Filed Vitals:   01/26/13 0502  BP: 126/78  Pulse: 75  Temp: 37.2 C  Resp: 18    Post vital signs: Reviewed  Level of consciousness: awake  Complications: No apparent anesthesia complications

## 2013-01-26 NOTE — Transfer of Care (Signed)
Immediate Anesthesia Transfer of Care Note  Patient: Maria Lin  Procedure(s) Performed: Procedure(s): CESAREAN SECTION of baby girl   at [phone]  APGAR 9/9 (N/A)  Patient Location: PACU  Anesthesia Type:Epidural  Level of Consciousness: awake, alert , oriented and patient cooperative  Airway & Oxygen Therapy: Patient Spontanous Breathing  Post-op Assessment: Report given to PACU RN, Post -op Vital signs reviewed and stable and Patient moving all extremities X 4  Post vital signs: Reviewed and stable  Complications: No apparent anesthesia complications

## 2013-01-26 NOTE — OR Nursing (Signed)
100 ml blood loss during fundal massage  By DLWegner RN

## 2013-01-26 NOTE — Progress Notes (Signed)
VE essentially unchanged Discussed c-section procedure and risks, will proceed.

## 2013-01-27 ENCOUNTER — Encounter (HOSPITAL_COMMUNITY): Payer: Self-pay | Admitting: Obstetrics and Gynecology

## 2013-01-27 LAB — CBC
HCT: 31.6 % — ABNORMAL LOW (ref 36.0–46.0)
Hemoglobin: 7.1 g/dL — ABNORMAL LOW (ref 12.0–15.0)
MCH: 28.9 pg (ref 26.0–34.0)
RBC: 3.46 MIL/uL — ABNORMAL LOW (ref 3.87–5.11)

## 2013-01-27 MED ORDER — RHO D IMMUNE GLOBULIN 1500 UNIT/2ML IJ SOLN
300.0000 ug | Freq: Once | INTRAMUSCULAR | Status: AC
  Administered 2013-01-27: 300 ug via INTRAMUSCULAR
  Filled 2013-01-27: qty 2

## 2013-01-27 NOTE — Anesthesia Postprocedure Evaluation (Signed)
  Anesthesia Post-op Note  Patient: Maria Lin  Procedure(s) Performed: Procedure(s): CESAREAN SECTION of baby girl   at [phone]  APGAR 9/9 (N/A)  Patient Location: PACU and Mother/Baby  Anesthesia Type:Epidural  Level of Consciousness: awake  Airway and Oxygen Therapy: Patient Spontanous Breathing  Post-op Pain: none  Post-op Assessment: Patient's Cardiovascular Status Stable, Respiratory Function Stable, RESPIRATORY FUNCTION UNSTABLE, No signs of Nausea or vomiting, Adequate PO intake, Pain level controlled, No headache, No backache, No residual numbness and No residual motor weakness  Post-op Vital Signs: Reviewed and stable  Complications: No apparent anesthesia complications

## 2013-01-27 NOTE — Progress Notes (Signed)
Subjective: Postpartum Day 1: Cesarean Delivery Patient reports incisional pain and tolerating PO.    Objective: Vital signs in last 24 hours: Temp:  [97.6 F (36.4 C)-98.4 F (36.9 C)] 98 F (36.7 C) (02/11 0620) Pulse Rate:  [55-91] 91 (02/11 0620) Resp:  [18-20] 18 (02/11 0620) BP: (96-123)/(60-76) 115/73 mmHg (02/11 0620) SpO2:  [96 %-100 %] 96 % (02/11 0620)  Physical Exam:  General: alert Lochia: appropriate Uterine Fundus: firm Incision: healing well   Recent Labs  01/25/13 2030 01/27/13 0455  HGB 12.3 7.1*  HCT 36.3 31.6*    Assessment/Plan: Status post Cesarean section. Doing well postoperatively.  Continue current care, ambulate.  Maria Lin D 01/27/2013, 8:40 AM

## 2013-01-28 LAB — RH IG WORKUP (INCLUDES ABO/RH)
ABO/RH(D): O NEG
Gestational Age(Wks): 40
Unit division: 0

## 2013-01-28 MED ORDER — HYDROMORPHONE HCL 2 MG PO TABS: 2.0000 mg | ORAL_TABLET | ORAL | Status: DC | PRN

## 2013-01-28 MED ORDER — IBUPROFEN 600 MG PO TABS: 600.0000 mg | ORAL_TABLET | Freq: Four times a day (QID) | ORAL | Status: DC

## 2013-01-28 NOTE — Discharge Summary (Signed)
Obstetric Discharge Summary Reason for Admission: onset of labor Prenatal Procedures: none Intrapartum Procedures: cesarean: low cervical, transverse Postpartum Procedures: none Complications-Operative and Postpartum: none Hemoglobin  Date Value Range Status  01/27/2013 7.1* 12.0 - 15.0 g/dL Final     DELTA CHECK NOTED     REPEATED TO VERIFY     HCT  Date Value Range Status  01/27/2013 31.6* 36.0 - 46.0 % Final    Physical Exam:  General: alert Lochia: appropriate Uterine Fundus: firm Incision: healing well  Discharge Diagnoses: Term Pregnancy-delivered and Arrest of descent  Discharge Information: Date: 01/28/2013 Activity: pelvic rest and no strenuous activity Diet: routine Medications: Ibuprofen and Dilaudid Condition: stable Instructions: refer to practice specific booklet Discharge to: home Follow-up Information   Follow up with Jody Silas D, MD. Schedule an appointment as soon as possible for a visit in 5 days. (for staple removal)    Contact information:   [address], [address] [place] [address] [phone]       Newborn Data: Live born female  Birth Weight: 7 lb 11.3 oz (3495 g) APGAR: 9, 9  Home with mother.  Rubylee Zamarripa D 01/28/2013, 7:30 AM

## 2013-01-28 NOTE — Progress Notes (Signed)
POD #2 Doing well, no problems, wants to go home Afeb, VSS Abd- soft, fundus firm, incision intact D/c home 

## 2013-02-02 ENCOUNTER — Inpatient Hospital Stay (HOSPITAL_COMMUNITY): Admission: RE | Admit: 2013-02-02 | Payer: BC Managed Care – PPO | Source: Ambulatory Visit

## 2014-10-01 ENCOUNTER — Other Ambulatory Visit: Payer: Self-pay

## 2014-10-18 ENCOUNTER — Encounter (HOSPITAL_COMMUNITY): Payer: Self-pay | Admitting: Obstetrics and Gynecology

## 2015-02-03 ENCOUNTER — Encounter (HOSPITAL_COMMUNITY)
Admission: RE | Admit: 2015-02-03 | Discharge: 2015-02-03 | Disposition: A | Discharge status: Home / Self Care | Payer: Managed Care, Other (non HMO) | Source: Ambulatory Visit | Attending: Obstetrics and Gynecology | Admitting: Obstetrics and Gynecology

## 2015-02-03 ENCOUNTER — Encounter (HOSPITAL_COMMUNITY): Payer: Self-pay

## 2015-02-03 DIAGNOSIS — Z01818 Encounter for other preprocedural examination: Secondary | ICD-10-CM | POA: Insufficient documentation

## 2015-02-03 LAB — CBC
HEMATOCRIT: 38.1 % (ref 36.0–46.0)
HEMOGLOBIN: 12.7 g/dL (ref 12.0–15.0)
MCH: 29 pg (ref 26.0–34.0)
MCHC: 33.3 g/dL (ref 30.0–36.0)
MCV: 87 fL (ref 78.0–100.0)
Platelets: 293 10*3/uL (ref 150–400)
RBC: 4.38 MIL/uL (ref 3.87–5.11)
RDW: 13.3 % (ref 11.5–15.5)
WBC: 9 10*3/uL (ref 4.0–10.5)

## 2015-02-03 NOTE — Patient Instructions (Signed)
   Your procedure is scheduled on: FEB 24 AT 730AM  Enter through the Main Entrance of [facility] at:6AM  Pick up the phone at the desk and dial [phone] and inform us of your arrival.  Please call this number if you have any problems the morning of surgery: [phone]  Remember: Do not eat food after midnight: FEB 23 Do not drink clear liquids after:FEB 23 Take these medicines the morning of surgery with a SIP OF WATER:  Do not wear jewelry, make-up, or FINGER nail polish No metal in your hair or on your body. Do not wear lotions, powders, perfumes.  You may wear deodorant.  Do not bring valuables to the hospital. Contacts, dentures or bridgework may not be worn into surgery.     Patients discharged on the day of surgery will not be allowed to drive home.

## 2015-02-07 NOTE — H&P (Signed)
Maria Lin is an 29 y.o. female. G2P2 who presents for a scheduled laparoscopic sterilization.  She is permanently done with childbearing.  Currently she is using OCP's for birth control.  Pertinent Gynecological History: OB History: NSVD x 1 C-section x 1    Menstrual History:  No LMP recorded.    Past Medical History  Diagnosis Date  . PONV (postoperative nausea and vomiting)     with labor   . Depression     hx depression and pp depression    Past Surgical History  Procedure Laterality Date  . No past surgeries    . Cesarean section N/A 01/26/2013    Procedure: CESAREAN SECTION of baby girl   at 0613  APGAR 9/9;  Surgeon: Lavina Hammanodd Meisinger, MD;  Location: [facility];  Service: Obstetrics;  Laterality: N/A;    Family History  Problem Relation Age of Onset  . Cancer Maternal Aunt     breast, colon  . Hypertension Maternal Aunt   . Cancer Maternal Grandmother     cervical and ovarian  . Cancer Maternal Grandfather     colon  . Stroke Paternal Grandfather   . Heart disease Paternal Grandfather   . Heart attack Paternal Grandfather   . Other Father     allergic to novacaine    Social History:  reports that she has never smoked. She has never used smokeless tobacco. She reports that she does not drink alcohol or use illicit drugs.  Allergies:  Allergies  Allergen Reactions  . Codeine Nausea And Vomiting  . Penicillins Hives  . Vicodin [Hydrocodone-Acetaminophen] Nausea And Vomiting    No prescriptions prior to admission    ROS  Physical Exam  Constitutional: She is oriented to person, place, and time. She appears well-developed and well-nourished.  Cardiovascular: Normal rate and regular rhythm.   GI: Soft.  Pfannenstiel incision  Genitourinary: Vagina normal and uterus normal.  Neurological: She is alert and oriented to person, place, and time.  Psychiatric: She has a normal mood and affect.    No results found for this or any previous visit (from the  past 24 hour(s)).  No results found.  Assessment/Plan: d/w pt her laparoscopy in detail. d/w her risks and benefits including bleeding, infection, and possible damage to bowel and bladder with possible prolonged recovery and need for a larger incision should a complication arise. We discussed tubal fulguration in detail and a 1/100 risk of failure with unintended pregnancy. We also discussed increased risk of ectopic, should pregnancy occur. Pt desires to proceed.    Oliver PilaRICHARDSON,Kayton Dunaj W 02/07/2015, 9:26 PM

## 2015-02-09 ENCOUNTER — Ambulatory Visit (HOSPITAL_COMMUNITY): Payer: Managed Care, Other (non HMO) | Admitting: Anesthesiology

## 2015-02-09 ENCOUNTER — Ambulatory Visit (HOSPITAL_COMMUNITY)
Admission: RE | Admit: 2015-02-09 | Discharge: 2015-02-09 | Disposition: A | Discharge status: Home / Self Care | Payer: Managed Care, Other (non HMO) | Source: Ambulatory Visit | Attending: Obstetrics and Gynecology | Admitting: Obstetrics and Gynecology

## 2015-02-09 ENCOUNTER — Encounter (HOSPITAL_COMMUNITY): Payer: Self-pay | Admitting: *Deleted

## 2015-02-09 ENCOUNTER — Encounter (HOSPITAL_COMMUNITY)
Admission: RE | Disposition: A | Discharge status: Home / Self Care | Payer: Self-pay | Source: Ambulatory Visit | Attending: Obstetrics and Gynecology

## 2015-02-09 DIAGNOSIS — Z302 Encounter for sterilization: Secondary | ICD-10-CM | POA: Diagnosis not present

## 2015-02-09 HISTORY — PX: LAPAROSCOPIC TUBAL LIGATION: SHX1937

## 2015-02-09 LAB — PREGNANCY, URINE: Preg Test, Ur: NEGATIVE

## 2015-02-09 SURGERY — LIGATION, FALLOPIAN TUBE, LAPAROSCOPIC
Anesthesia: General | Site: Abdomen | Laterality: Bilateral

## 2015-02-09 MED ORDER — MIDAZOLAM HCL 2 MG/2ML IJ SOLN
INTRAMUSCULAR | Status: DC | PRN
  Administered 2015-02-09: 2 mg via INTRAVENOUS

## 2015-02-09 MED ORDER — SCOPOLAMINE 1 MG/3DAYS TD PT72
1.0000 | MEDICATED_PATCH | Freq: Once | TRANSDERMAL | Status: DC
  Administered 2015-02-09: 1.5 mg via TRANSDERMAL

## 2015-02-09 MED ORDER — FENTANYL CITRATE 0.05 MG/ML IJ SOLN: 25.0000 ug | INTRAMUSCULAR | Status: DC | PRN

## 2015-02-09 MED ORDER — PROPOFOL 10 MG/ML IV BOLUS
INTRAVENOUS | Status: AC
  Filled 2015-02-09: qty 20

## 2015-02-09 MED ORDER — BUPIVACAINE HCL (PF) 0.25 % IJ SOLN
INTRAMUSCULAR | Status: AC
  Filled 2015-02-09: qty 10

## 2015-02-09 MED ORDER — SCOPOLAMINE 1 MG/3DAYS TD PT72
MEDICATED_PATCH | TRANSDERMAL | Status: AC
  Filled 2015-02-09: qty 1

## 2015-02-09 MED ORDER — DEXAMETHASONE SODIUM PHOSPHATE 10 MG/ML IJ SOLN
INTRAMUSCULAR | Status: DC | PRN
  Administered 2015-02-09: 4 mg via INTRAVENOUS

## 2015-02-09 MED ORDER — ONDANSETRON HCL 4 MG/2ML IJ SOLN
INTRAMUSCULAR | Status: DC | PRN
  Administered 2015-02-09: 4 mg via INTRAVENOUS

## 2015-02-09 MED ORDER — GLYCOPYRROLATE 0.2 MG/ML IJ SOLN
INTRAMUSCULAR | Status: DC | PRN
  Administered 2015-02-09: 0.4 mg via INTRAVENOUS

## 2015-02-09 MED ORDER — ONDANSETRON HCL 4 MG/2ML IJ SOLN
INTRAMUSCULAR | Status: AC
  Filled 2015-02-09: qty 2

## 2015-02-09 MED ORDER — MEPERIDINE HCL 25 MG/ML IJ SOLN: 6.2500 mg | INTRAMUSCULAR | Status: DC | PRN

## 2015-02-09 MED ORDER — IBUPROFEN 200 MG PO TABS: 600.0000 mg | ORAL_TABLET | Freq: Four times a day (QID) | ORAL | Status: DC | PRN

## 2015-02-09 MED ORDER — FENTANYL CITRATE 0.05 MG/ML IJ SOLN
INTRAMUSCULAR | Status: AC
  Filled 2015-02-09: qty 5

## 2015-02-09 MED ORDER — DEXAMETHASONE SODIUM PHOSPHATE 4 MG/ML IJ SOLN
INTRAMUSCULAR | Status: AC
  Filled 2015-02-09: qty 1

## 2015-02-09 MED ORDER — LIDOCAINE HCL (CARDIAC) 20 MG/ML IV SOLN
INTRAVENOUS | Status: DC | PRN
  Administered 2015-02-09: 50 mg via INTRAVENOUS

## 2015-02-09 MED ORDER — SODIUM CHLORIDE 0.9 % IJ SOLN
INTRAMUSCULAR | Status: DC | PRN
  Administered 2015-02-09: 10 mL

## 2015-02-09 MED ORDER — SILVER NITRATE-POT NITRATE 75-25 % EX MISC
CUTANEOUS | Status: DC | PRN
  Administered 2015-02-09: 5
  Administered 2015-02-09: 2

## 2015-02-09 MED ORDER — BUPIVACAINE HCL (PF) 0.25 % IJ SOLN
INTRAMUSCULAR | Status: DC | PRN
  Administered 2015-02-09: 10 mL

## 2015-02-09 MED ORDER — METOCLOPRAMIDE HCL 5 MG/ML IJ SOLN
10.0000 mg | Freq: Once | INTRAMUSCULAR | Status: AC | PRN
  Administered 2015-02-09: 10 mg via INTRAVENOUS

## 2015-02-09 MED ORDER — METOCLOPRAMIDE HCL 5 MG/ML IJ SOLN
INTRAMUSCULAR | Status: AC
  Filled 2015-02-09: qty 2

## 2015-02-09 MED ORDER — ROCURONIUM BROMIDE 100 MG/10ML IV SOLN
INTRAVENOUS | Status: AC
  Filled 2015-02-09: qty 1

## 2015-02-09 MED ORDER — LACTATED RINGERS IV SOLN
INTRAVENOUS | Status: DC
  Administered 2015-02-09 (×2): via INTRAVENOUS

## 2015-02-09 MED ORDER — MIDAZOLAM HCL 2 MG/2ML IJ SOLN
INTRAMUSCULAR | Status: AC
  Filled 2015-02-09: qty 2

## 2015-02-09 MED ORDER — GLYCOPYRROLATE 0.2 MG/ML IJ SOLN
INTRAMUSCULAR | Status: AC
  Filled 2015-02-09: qty 2

## 2015-02-09 MED ORDER — FENTANYL CITRATE 0.05 MG/ML IJ SOLN
INTRAMUSCULAR | Status: DC | PRN
  Administered 2015-02-09: 100 ug via INTRAVENOUS
  Administered 2015-02-09: 50 ug via INTRAVENOUS

## 2015-02-09 MED ORDER — NEOSTIGMINE METHYLSULFATE 10 MG/10ML IV SOLN
INTRAVENOUS | Status: AC
  Filled 2015-02-09: qty 1

## 2015-02-09 MED ORDER — NEOSTIGMINE METHYLSULFATE 10 MG/10ML IV SOLN
INTRAVENOUS | Status: DC | PRN
  Administered 2015-02-09: 3 mg via INTRAVENOUS

## 2015-02-09 MED ORDER — PROPOFOL 10 MG/ML IV BOLUS
INTRAVENOUS | Status: DC | PRN
  Administered 2015-02-09: 180 mg via INTRAVENOUS

## 2015-02-09 MED ORDER — KETOROLAC TROMETHAMINE 30 MG/ML IJ SOLN
INTRAMUSCULAR | Status: AC
  Filled 2015-02-09: qty 1

## 2015-02-09 MED ORDER — LACTATED RINGERS IV SOLN
INTRAVENOUS | Status: DC
  Administered 2015-02-09: 06:00:00 via INTRAVENOUS

## 2015-02-09 MED ORDER — SILVER NITRATE-POT NITRATE 75-25 % EX MISC
CUTANEOUS | Status: AC
  Filled 2015-02-09: qty 1

## 2015-02-09 MED ORDER — KETOROLAC TROMETHAMINE 30 MG/ML IJ SOLN
INTRAMUSCULAR | Status: DC | PRN
  Administered 2015-02-09: 30 mg via INTRAVENOUS

## 2015-02-09 MED ORDER — LIDOCAINE HCL (CARDIAC) 20 MG/ML IV SOLN
INTRAVENOUS | Status: AC
  Filled 2015-02-09: qty 5

## 2015-02-09 MED ORDER — SODIUM CHLORIDE 0.9 % IJ SOLN
INTRAMUSCULAR | Status: AC
  Filled 2015-02-09: qty 10

## 2015-02-09 MED ORDER — ROCURONIUM BROMIDE 100 MG/10ML IV SOLN
INTRAVENOUS | Status: DC | PRN
  Administered 2015-02-09: 10 mg via INTRAVENOUS
  Administered 2015-02-09: 30 mg via INTRAVENOUS

## 2015-02-09 SURGICAL SUPPLY — 18 items
CATH ROBINSON RED A/P 16FR (CATHETERS) ×3 IMPLANT
CHLORAPREP W/TINT 26ML (MISCELLANEOUS) ×3 IMPLANT
CLOTH BEACON ORANGE TIMEOUT ST (SAFETY) ×3 IMPLANT
DRSG COVADERM PLUS 2X2 (GAUZE/BANDAGES/DRESSINGS) ×6 IMPLANT
DRSG OPSITE POSTOP 3X4 (GAUZE/BANDAGES/DRESSINGS) ×2 IMPLANT
GLOVE BIO SURGEON STRL SZ 6.5 (GLOVE) ×3 IMPLANT
GLOVE BIO SURGEONS STRL SZ 6.5 (GLOVE) ×2
GOWN STRL REUS W/TWL LRG LVL3 (GOWN DISPOSABLE) ×6 IMPLANT
LIQUID BAND (GAUZE/BANDAGES/DRESSINGS) ×3 IMPLANT
NEEDLE INSUFFLATION 120MM (ENDOMECHANICALS) ×3 IMPLANT
PACK LAPAROSCOPY BASIN (CUSTOM PROCEDURE TRAY) ×3 IMPLANT
SUT VIC AB 3-0 PS2 18 (SUTURE) ×3
SUT VIC AB 3-0 PS2 18XBRD (SUTURE) IMPLANT
SUT VICRYL 0 UR6 27IN ABS (SUTURE) ×3 IMPLANT
TOWEL OR 17X24 6PK STRL BLUE (TOWEL DISPOSABLE) ×6 IMPLANT
TROCAR XCEL NON-BLD 11X100MML (ENDOMECHANICALS) ×3 IMPLANT
WARMER LAPAROSCOPE (MISCELLANEOUS) ×3 IMPLANT
WATER STERILE IRR 1000ML POUR (IV SOLUTION) ×3 IMPLANT

## 2015-02-09 NOTE — Anesthesia Preprocedure Evaluation (Signed)
Anesthesia Evaluation  Patient identified by MRN, date of birth, ID band Patient awake    Reviewed: Allergy & Precautions, NPO status , Patient's Chart, lab work & pertinent test results  History of Anesthesia Complications (+) PONV and history of anesthetic complications  Airway Mallampati: II  TM Distance: >3 FB Neck ROM: Full    Dental no notable dental hx. (+) Dental Advisory Given, Poor Dentition, Chipped   Pulmonary neg pulmonary ROS,  breath sounds clear to auscultation  Pulmonary exam normal       Cardiovascular negative cardio ROS  Rhythm:Regular Rate:Normal     Neuro/Psych PSYCHIATRIC DISORDERS Anxiety Depression negative neurological ROS     GI/Hepatic negative GI ROS, Neg liver ROS,   Endo/Other  Obesity   Renal/GU negative Renal ROS  negative genitourinary   Musculoskeletal negative musculoskeletal ROS (+)   Abdominal   Peds negative pediatric ROS (+)  Hematology negative hematology ROS (+)   Anesthesia Other Findings   Reproductive/Obstetrics negative OB ROS                             Anesthesia Physical Anesthesia Plan  ASA: II  Anesthesia Plan: General   Post-op Pain Management:    Induction: Intravenous  Airway Management Planned: Oral ETT  Additional Equipment:   Intra-op Plan:   Post-operative Plan: Extubation in OR  Informed Consent: I have reviewed the patients History and Physical, chart, labs and discussed the procedure including the risks, benefits and alternatives for the proposed anesthesia with the patient or authorized representative who has indicated his/her understanding and acceptance.   Dental advisory given  Plan Discussed with: CRNA  Anesthesia Plan Comments:         Anesthesia Quick Evaluation

## 2015-02-09 NOTE — Discharge Instructions (Signed)

## 2015-02-09 NOTE — Anesthesia Postprocedure Evaluation (Signed)
  Anesthesia Post-op Note  Patient: Maria Lin  Procedure(s) Performed: Procedure(s): LAPAROSCOPIC TUBAL LIGATION (Bilateral)  Patient Location: PACU  Anesthesia Type:General  Level of Consciousness: awake, alert  and oriented  Airway and Oxygen Therapy: Patient Spontanous Breathing  Post-op Pain: mild  Post-op Assessment: Post-op Vital signs reviewed, Patient's Cardiovascular Status Stable, Respiratory Function Stable, Patent Airway, No signs of Nausea or vomiting and Pain level controlled  Post-op Vital Signs: Reviewed and stable  Last Vitals:  Filed Vitals:   02/09/15 0915  BP: 101/51  Pulse: 56  Temp:   Resp: 11    Complications: No apparent anesthesia complications

## 2015-02-09 NOTE — Transfer of Care (Signed)
Immediate Anesthesia Transfer of Care Note  Patient: Maria Lin  Procedure(s) Performed: Procedure(s): LAPAROSCOPIC TUBAL LIGATION (Bilateral)  Patient Location: PACU  Anesthesia Type:General  Level of Consciousness: awake  Airway & Oxygen Therapy: Patient Spontanous Breathing  Post-op Assessment: Report given to PACU RN  Post vital signs: stable  Filed Vitals:   02/09/15 0555  BP: 130/74  Pulse:   Temp:   Resp:     Complications: No apparent anesthesia complications

## 2015-02-09 NOTE — Progress Notes (Signed)
Patient ID: Maria Lin, female   DOB: May 24, 1986, 29 y.o.   MRN: [id number] Per pt no changes in dictated H&P  Ready to proceed.

## 2015-02-09 NOTE — Op Note (Signed)
Operative Note   Preoperative Diagnosis Desires permanent sterility  Postoperative diagnosis Same  Procedure Laparoscopic tubal sterilization with fulguration  Surgeon Huel CoteKathy Kasean Denherder  Anesthesia  General  Fluids LR 1200cc UOP 100 cc straight cath prior to procedure EBL 50cc  Findings Normal uterus and tubes and ovaries.  Omental adhesion to anterior abdominal wall taken down   Specimen none  Procedure  Patient was taken to the operating room where general anesthesia was obtained without difficulty. She was then prepped and draped in the normal sterile fashion in the dorsal lithotomy position. An appropriate timeout was performed. A speculum was then placed within the vagina and an acorn tenaculum placed within the cervix for uterine manipulation. The bladder was emptied. Attention was then turned to the patient's abdomen after draping where the infraumbilical area was injected with approximately 10 cc of quarter percent Marcaine. A 1 cm incision was then made within the umbilicus and the varies needle easily introduced into the peritoneal cavity. Intraperitoneal placement was confirmed by aspiration and injection with normal saline. Gas flow was then applied and a pneumoperitoneum obtained with approximate 3 L of CO2 gas. The varies needle was then removed and the 11 mm trocar was easily introduced into the abdomen. With patient in Trendelenburg the uterus and tubes and ovaries were inspected with findings as previously stated. A bipolar cautery was then introduced through the operative scope and the left fallopian tube grasped approximately 3 cm from the uterine cornua and cauterized in 2 sequential areas with good blanching noted. Attention was then turned to the right fallopian tube which was likewise grasped proximally 3 cm from the uterine cornua and cauterized into sequential spots with good blanching noted there as well. The remainder of the pelvis and abdomen were inspected  and found to be normal. The instruments were removed from the abdomen and the pneumoperitoneum reduced through the trocar. The trocar was finally removed and the infraumbilical incision closed with one deep stitch of 0 Vicryl and a subcuticular stitch of 3-0 Vicryl. The tenaculum was removed from the vagina and the site was bleeding so silver nitrate and pressure applied until hemostasis obtained.  Dermabond and a bandage were placed. Patient was then awakened and taken to the recovery room in good condition.

## 2015-02-10 ENCOUNTER — Encounter (HOSPITAL_COMMUNITY): Payer: Self-pay | Admitting: Obstetrics and Gynecology

## 2017-09-16 ENCOUNTER — Encounter (HOSPITAL_COMMUNITY): Payer: Self-pay | Admitting: Emergency Medicine

## 2017-09-16 ENCOUNTER — Emergency Department (HOSPITAL_COMMUNITY)
Admission: EM | Admit: 2017-09-16 | Discharge: 2017-09-16 | Disposition: A | Discharge status: Home / Self Care | Payer: Managed Care, Other (non HMO) | Attending: Emergency Medicine | Admitting: Emergency Medicine

## 2017-09-16 DIAGNOSIS — Z79899 Other long term (current) drug therapy: Secondary | ICD-10-CM | POA: Insufficient documentation

## 2017-09-16 DIAGNOSIS — N390 Urinary tract infection, site not specified: Secondary | ICD-10-CM | POA: Insufficient documentation

## 2017-09-16 DIAGNOSIS — B379 Candidiasis, unspecified: Secondary | ICD-10-CM

## 2017-09-16 HISTORY — DX: Migraine, unspecified, not intractable, without status migrainosus: G43.909

## 2017-09-16 LAB — CBC
HCT: 35.7 % — ABNORMAL LOW (ref 36.0–46.0)
Hemoglobin: 12 g/dL (ref 12.0–15.0)
MCH: 29.7 pg (ref 26.0–34.0)
MCHC: 33.6 g/dL (ref 30.0–36.0)
MCV: 88.4 fL (ref 78.0–100.0)
Platelets: 302 10*3/uL (ref 150–400)
RBC: 4.04 MIL/uL (ref 3.87–5.11)
RDW: 12.2 % (ref 11.5–15.5)
WBC: 13.2 10*3/uL — ABNORMAL HIGH (ref 4.0–10.5)

## 2017-09-16 LAB — URINALYSIS, ROUTINE W REFLEX MICROSCOPIC
Bilirubin Urine: NEGATIVE
Glucose, UA: NEGATIVE mg/dL
Ketones, ur: 5 mg/dL — AB
Nitrite: NEGATIVE
Protein, ur: 30 mg/dL — AB
Specific Gravity, Urine: 1.011 (ref 1.005–1.030)
pH: 6 (ref 5.0–8.0)

## 2017-09-16 LAB — BASIC METABOLIC PANEL
Anion gap: 9 (ref 5–15)
BUN: 9 mg/dL (ref 6–20)
CHLORIDE: 103 mmol/L (ref 101–111)
CO2: 26 mmol/L (ref 22–32)
CREATININE: 0.53 mg/dL (ref 0.44–1.00)
Calcium: 9.1 mg/dL (ref 8.9–10.3)
GFR calc Af Amer: >60 mL/min (ref >60)
GFR calc non Af Amer: >60 mL/min (ref >60)
GLUCOSE: 93 mg/dL (ref 65–99)
Potassium: 3.4 mmol/L — ABNORMAL LOW (ref 3.5–5.1)
SODIUM: 138 mmol/L (ref 135–145)

## 2017-09-16 LAB — PREGNANCY, URINE: Preg Test, Ur: NEGATIVE

## 2017-09-16 MED ORDER — FLUCONAZOLE 200 MG PO TABS: 200.0000 mg | ORAL_TABLET | Freq: Every day | ORAL | 0 refills | Status: AC

## 2017-09-16 MED ORDER — PHENAZOPYRIDINE HCL 100 MG PO TABS
200.0000 mg | ORAL_TABLET | Freq: Once | ORAL | Status: AC
  Administered 2017-09-16: 200 mg via ORAL
  Filled 2017-09-16: qty 2

## 2017-09-16 MED ORDER — PHENAZOPYRIDINE HCL 200 MG PO TABS: 200.0000 mg | ORAL_TABLET | Freq: Three times a day (TID) | ORAL | 0 refills | Status: DC

## 2017-09-16 MED ORDER — CEPHALEXIN 500 MG PO CAPS: 500.0000 mg | ORAL_CAPSULE | Freq: Two times a day (BID) | ORAL | 0 refills | Status: DC

## 2017-09-16 MED ORDER — FLUCONAZOLE 100 MG PO TABS
100.0000 mg | ORAL_TABLET | Freq: Once | ORAL | Status: AC
  Administered 2017-09-16: 100 mg via ORAL
  Filled 2017-09-16: qty 1

## 2017-09-16 MED ORDER — CEPHALEXIN 500 MG PO CAPS
500.0000 mg | ORAL_CAPSULE | Freq: Once | ORAL | Status: AC
  Administered 2017-09-16: 500 mg via ORAL
  Filled 2017-09-16: qty 1

## 2017-09-16 MED ORDER — CEPHALEXIN 500 MG PO CAPS
ORAL_CAPSULE | ORAL | Status: AC
  Filled 2017-09-16: qty 1

## 2017-09-16 NOTE — ED Triage Notes (Signed)
Pt c/o lower back pain, hematuria, and lower abd pain. Pt states she has pressure when she tries to urinate.

## 2017-09-16 NOTE — ED Provider Notes (Signed)
[facility] Provider Note   CSN: [id number] Arrival date & time: 09/16/17  1814     History   Chief Complaint Chief Complaint  Patient presents with  . Hematuria    HPI Maria Lin is a 31 y.o. female.  The history is provided by the patient.  Hematuria  This is a new problem. The current episode started 12 to 24 hours ago. The problem occurs constantly. The problem has been gradually worsening. Associated symptoms include abdominal pain. Associated symptoms comments: Lower back pain, dysuria, frequency, urgency.  No vaginal discharge or bleeding.  No fever or nausea or vomiting.  . Exacerbated by: urinating. Nothing relieves the symptoms. She has tried nothing for the symptoms. The treatment provided no relief.    Past Medical History:  Diagnosis Date  . Depression    hx depression and pp depression  . Migraines   . PONV (postoperative nausea and vomiting)    with labor     Patient Active Problem List   Diagnosis Date Noted  . Arrested active phase of labor 01/26/2013  . S/P cesarean section 01/26/2013  . Active labor at term 01/25/2013    Past Surgical History:  Procedure Laterality Date  . CESAREAN SECTION N/A 01/26/2013   Procedure: CESAREAN SECTION of baby girl   at 0613  APGAR 9/9;  Surgeon: Lavina Hamman, MD;  Location: [facility];  Service: Obstetrics;  Laterality: N/A;  . LAPAROSCOPIC TUBAL LIGATION Bilateral 02/09/2015   Procedure: LAPAROSCOPIC TUBAL LIGATION;  Surgeon: Oliver Pila, MD;  Location: [facility];  Service: Gynecology;  Laterality: Bilateral;  . NO PAST SURGERIES      OB History    Gravida Para Term Preterm AB Living   SAB TAB Ectopic Multiple Live Births           2       Home Medications    Prior to Admission medications   Medication Sig Start Date End Date Taking? Authorizing Provider  cephALEXin (KEFLEX) 500 MG capsule Take 1 capsule (500 mg total) by mouth 2 (two) times daily. 09/16/17   Gwyneth Sprout, MD  fluconazole (DIFLUCAN) 200 MG tablet Take 1 tablet (200 mg total) by mouth daily. For yeast infection as needed 09/16/17 09/23/17  Gwyneth Sprout, MD  HYDROmorphone (DILAUDID) 2 MG tablet Take 1 tablet (2 mg total) by mouth every 3 (three) hours as needed. Patient not taking: Reported on 01/25/2015 01/28/13   Meisinger, Tawanna Cooler, MD  ibuprofen (ADVIL) 200 MG tablet Take 3 tablets (600 mg total) by mouth every 6 (six) hours as needed for moderate pain. 02/09/15   Huel Cote, MD  phenazopyridine (PYRIDIUM) 200 MG tablet Take 1 tablet (200 mg total) by mouth 3 (three) times daily. 09/16/17   Gwyneth Sprout, MD    Family History Family History  Problem Relation Age of Onset  . Cancer Maternal Aunt        breast, colon  . Hypertension Maternal Aunt   . Cancer Maternal Grandmother        cervical and ovarian  . Cancer Maternal Grandfather        colon  . Stroke Paternal Grandfather   . Heart disease Paternal Grandfather   . Heart attack Paternal Grandfather   . Other Father        allergic to novacaine    Social History Social History  Substance Use Topics  . Smoking status: Never Smoker  . Smokeless  tobacco: Never Used  . Alcohol use No     Allergies   Codeine; Penicillins; and Vicodin [hydrocodone-acetaminophen]   Review of Systems Review of Systems  Gastrointestinal: Positive for abdominal pain.  Genitourinary: Positive for hematuria.  All other systems reviewed and are negative.    Physical Exam Updated Vital Signs BP (!) 124/93   Pulse 79   Temp 98.1 F (36.7 C) (Oral)   Resp 16   Ht  (1.626 m)   Wt 94.3 kg (208 lb)   LMP 09/02/2017   SpO2 98%   BMI 35.70 kg/m   Physical Exam  Constitutional: She is oriented to person, place, and time. She appears well-developed and well-nourished. No distress.  HENT:  Head: Normocephalic and atraumatic.  Mouth/Throat: Oropharynx is clear and moist.  Eyes: Pupils are equal, round, and reactive to  light. Conjunctivae and EOM are normal.  Neck: Normal range of motion. Neck supple.  Cardiovascular: Normal rate, regular rhythm and intact distal pulses.   No murmur heard. Pulmonary/Chest: Effort normal and breath sounds normal. No respiratory distress. She has no wheezes. She has no rales.  Abdominal: Soft. She exhibits no distension. There is tenderness in the suprapubic area. There is CVA tenderness. There is no rebound and no guarding. No hernia.  Musculoskeletal: Normal range of motion. She exhibits no edema or tenderness.  Neurological: She is alert and oriented to person, place, and time.  Skin: Skin is warm and dry. No rash noted. No erythema.  Psychiatric: She has a normal mood and affect. Her behavior is normal.  Nursing note and vitals reviewed.    ED Treatments / Results  Labs (all labs ordered are listed, but only abnormal results are displayed) Labs Reviewed  URINALYSIS, ROUTINE W REFLEX MICROSCOPIC - Abnormal; Notable for the following:       Result Value   APPearance HAZY (*)    Hgb urine dipstick LARGE (*)    Ketones, ur 5 (*)    Protein, ur 30 (*)    Leukocytes, UA SMALL (*)    Bacteria, UA RARE (*)    Squamous Epithelial / LPF 0-5 (*)    All other components within normal limits  BASIC METABOLIC PANEL - Abnormal; Notable for the following:    Potassium 3.4 (*)    All other components within normal limits  CBC - Abnormal; Notable for the following:    WBC 13.2 (*)    HCT 35.7 (*)    All other components within normal limits  URINE CULTURE  PREGNANCY, URINE    EKG  EKG Interpretation None       Radiology No results found.  Procedures Procedures (including critical care time)  Medications Ordered in ED Medications  cephALEXin (KEFLEX) capsule 500 mg (500 mg Oral Given 09/16/17 2256)  fluconazole (DIFLUCAN) tablet 100 mg (100 mg Oral Given 09/16/17 2256)  phenazopyridine (PYRIDIUM) tablet 200 mg (200 mg Oral Given 09/16/17 2256)     Initial  Impression / Assessment and Plan / ED Course  I have reviewed the triage vital signs and the nursing notes.  Pertinent labs & imaging results that were available during my care of the patient were reviewed by me and considered in my medical decision making (see chart for details).     Patient presenting with symptoms most suggestive of an acute urinary tract infection. Symptoms started today with frequency, urgency, dribbling, hematuria. She does have back pain but denies fever nausea or vomiting. Low suspicion for kidney stone at  this time, pyelonephritis or pelvic infection. Patient was treated with antibiotics  and a urine culture was sent  Final Clinical Impressions(s) / ED Diagnoses   Final diagnoses:  Lower urinary tract infectious disease  Candidiasis    New Prescriptions Discharge Medication List as of 09/16/2017 10:46 PM    START taking these medications   Details  cephALEXin (KEFLEX) 500 MG capsule Take 1 capsule (500 mg total) by mouth 2 (two) times daily., Starting Mon 09/16/2017, Print    fluconazole (DIFLUCAN) 200 MG tablet Take 1 tablet (200 mg total) by mouth daily. For yeast infection as needed, Starting Mon 09/16/2017, Until Mon 09/23/2017, Print    phenazopyridine (PYRIDIUM) 200 MG tablet Take 1 tablet (200 mg total) by mouth 3 (three) times daily., Starting Mon 09/16/2017, Print         Gwyneth Sprout, MD 09/17/17 0002

## 2017-09-19 LAB — URINE CULTURE: Culture: 40000 — AB

## 2017-09-20 ENCOUNTER — Telehealth: Payer: Self-pay | Admitting: *Deleted

## 2017-09-20 NOTE — Telephone Encounter (Signed)
Post ED Visit - Positive Culture Follow-up  Culture report reviewed by antimicrobial stewardship pharmacist:   Enzo Bi, Pharm.D.  Celedonio Miyamoto, [address].D., BCPS AQ-ID  Garvin Fila, Pharm.D., BCPS  Georgina Pillion, Pharm.D., BCPS  [place], [address].D., BCPS, AAHIVP  Estella Husk, Pharm.D., BCPS, AAHIVP  Lysle Pearl, PharmD, BCPS  Casilda Carls, PharmD, BCPS  Pollyann Samples, PharmD, BCPS  Positive urine culture Treated with Cephalexin, organism sensitive to the same and no further patient follow-up is required at this time.  Virl Axe Talley 09/20/2017, 10:22 AM

## 2018-08-30 ENCOUNTER — Emergency Department (HOSPITAL_COMMUNITY)
Admission: EM | Admit: 2018-08-30 | Discharge: 2018-08-30 | Disposition: A | Discharge status: Home / Self Care | Payer: No Typology Code available for payment source | Attending: Emergency Medicine | Admitting: Emergency Medicine

## 2018-08-30 ENCOUNTER — Emergency Department (HOSPITAL_COMMUNITY): Payer: No Typology Code available for payment source

## 2018-08-30 ENCOUNTER — Other Ambulatory Visit: Payer: Self-pay

## 2018-08-30 ENCOUNTER — Encounter (HOSPITAL_COMMUNITY): Payer: Self-pay | Admitting: Emergency Medicine

## 2018-08-30 DIAGNOSIS — M25512 Pain in left shoulder: Secondary | ICD-10-CM | POA: Diagnosis not present

## 2018-08-30 DIAGNOSIS — F17210 Nicotine dependence, cigarettes, uncomplicated: Secondary | ICD-10-CM | POA: Insufficient documentation

## 2018-08-30 DIAGNOSIS — M545 Low back pain, unspecified: Secondary | ICD-10-CM

## 2018-08-30 DIAGNOSIS — R51 Headache: Secondary | ICD-10-CM | POA: Diagnosis not present

## 2018-08-30 DIAGNOSIS — R519 Headache, unspecified: Secondary | ICD-10-CM

## 2018-08-30 DIAGNOSIS — M542 Cervicalgia: Secondary | ICD-10-CM | POA: Diagnosis not present

## 2018-08-30 MED ORDER — METHOCARBAMOL 500 MG PO TABS: 500.0000 mg | ORAL_TABLET | Freq: Every evening | ORAL | 0 refills | Status: DC | PRN

## 2018-08-30 MED ORDER — IBUPROFEN 400 MG PO TABS
600.0000 mg | ORAL_TABLET | Freq: Once | ORAL | Status: AC
  Administered 2018-08-30: 600 mg via ORAL
  Filled 2018-08-30: qty 1

## 2018-08-30 NOTE — Discharge Instructions (Signed)
Take NSAIDs or Tylenol as needed for the next week. Take this medicine with food. °Take muscle relaxer at bedtime to help you sleep. This medicine makes you drowsy so do not take before driving or work °Use a heating pad for sore muscles - use for 20 minutes several times a day °Return for worsening symptoms ° °
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
## 2018-08-30 NOTE — ED Notes (Signed)
Pt states she is not pregnant and wants to sign the waiver for xray. Xray notified
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
## 2018-10-14 ENCOUNTER — Ambulatory Visit (INDEPENDENT_AMBULATORY_CARE_PROVIDER_SITE_OTHER): Payer: Self-pay | Admitting: Family Medicine

## 2018-10-14 ENCOUNTER — Encounter: Payer: Self-pay | Admitting: Family Medicine

## 2018-10-14 VITALS — BP 123/87 | HR 68 | Temp 97.8°F | Ht 63.0 in | Wt 191.0 lb

## 2018-10-14 DIAGNOSIS — F411 Generalized anxiety disorder: Secondary | ICD-10-CM

## 2018-10-14 DIAGNOSIS — Z23 Encounter for immunization: Secondary | ICD-10-CM

## 2018-10-14 DIAGNOSIS — R350 Frequency of micturition: Secondary | ICD-10-CM

## 2018-10-14 DIAGNOSIS — N946 Dysmenorrhea, unspecified: Secondary | ICD-10-CM | POA: Insufficient documentation

## 2018-10-14 DIAGNOSIS — F339 Major depressive disorder, recurrent, unspecified: Secondary | ICD-10-CM | POA: Insufficient documentation

## 2018-10-14 DIAGNOSIS — F32A Depression, unspecified: Secondary | ICD-10-CM | POA: Insufficient documentation

## 2018-10-14 DIAGNOSIS — N920 Excessive and frequent menstruation with regular cycle: Secondary | ICD-10-CM | POA: Insufficient documentation

## 2018-10-14 DIAGNOSIS — F329 Major depressive disorder, single episode, unspecified: Secondary | ICD-10-CM | POA: Insufficient documentation

## 2018-10-14 DIAGNOSIS — G43909 Migraine, unspecified, not intractable, without status migrainosus: Secondary | ICD-10-CM | POA: Insufficient documentation

## 2018-10-14 DIAGNOSIS — H6593 Unspecified nonsuppurative otitis media, bilateral: Secondary | ICD-10-CM

## 2018-10-14 DIAGNOSIS — Z7689 Persons encountering health services in other specified circumstances: Secondary | ICD-10-CM

## 2018-10-14 DIAGNOSIS — R102 Pelvic and perineal pain: Secondary | ICD-10-CM | POA: Insufficient documentation

## 2018-10-14 DIAGNOSIS — G44309 Post-traumatic headache, unspecified, not intractable: Secondary | ICD-10-CM

## 2018-10-14 LAB — MICROSCOPIC EXAMINATION: Renal Epithel, UA: NONE SEEN /hpf

## 2018-10-14 LAB — URINALYSIS, COMPLETE
Bilirubin, UA: NEGATIVE
GLUCOSE, UA: NEGATIVE
Ketones, UA: NEGATIVE
Nitrite, UA: NEGATIVE
Protein, UA: NEGATIVE
RBC, UA: NEGATIVE
SPEC GRAV UA: 1.01 (ref 1.005–1.030)
Urobilinogen, Ur: 0.2 mg/dL (ref 0.2–1.0)
pH, UA: 7 (ref 5.0–7.5)

## 2018-10-14 MED ORDER — SERTRALINE HCL 50 MG PO TABS
50.0000 mg | ORAL_TABLET | Freq: Every day | ORAL | 0 refills | Status: DC
Start: 2018-10-14 — End: 2018-10-28
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
## 2018-10-14 NOTE — Patient Instructions (Signed)
Migraine Headache A migraine headache is a very strong throbbing pain on one side or both sides of your head. Migraines can also cause other symptoms. Talk with your doctor about what things may bring on (trigger) your migraine headaches. Follow these instructions at home: Medicines  Take over-the-counter and prescription medicines only as told by your doctor.  Do not drive or use heavy machinery while taking prescription pain medicine.  To prevent or treat constipation while you are taking prescription pain medicine, your doctor may recommend that you: ? Drink enough fluid to keep your pee (urine) clear or pale yellow. ? Take over-the-counter or prescription medicines. ? Eat foods that are high in fiber. These include fresh fruits and vegetables, whole grains, and beans. ? Limit foods that are high in fat and processed sugars. These include fried and sweet foods. Lifestyle  Avoid alcohol.  Do not use any products that contain nicotine or tobacco, such as cigarettes and e-cigarettes. If you need help quitting, ask your doctor.  Get at least 8 hours of sleep every night.  Limit your stress. General instructions   Keep a journal to find out what may bring on your migraines. For example, write down: ? What you eat and drink. ? How much sleep you get. ? Any change in what you eat or drink. ? Any change in your medicines.  If you have a migraine: ? Avoid things that make your symptoms worse, such as bright lights. ? It may help to lie down in a dark, quiet room. ? Do not drive or use heavy machinery. ? Ask your doctor what activities are safe for you.  Keep all follow-up visits as told by your doctor. This is important. Contact a doctor if:  You get a migraine that is different or worse than your usual migraines. Get help right away if:  Your migraine gets very bad.  You have a fever.  You have a stiff neck.  You have trouble seeing.  Your muscles feel weak or like you  cannot control them.  You start to lose your balance a lot.  You start to have trouble walking.  You pass out (faint). This information is not intended to replace advice given to you by your health care provider. Make sure you discuss any questions you have with your health care provider. Document Released: 09/11/2008 Document Revised: 06/22/2016 Document Reviewed: 05/21/2016 Elsevier Interactive Patient Education  2018 [employer]. Living With Anxiety After being diagnosed with an anxiety disorder, you may be relieved to know why you have felt or behaved a certain way. It is natural to also feel overwhelmed about the treatment ahead and what it will mean for your life. With care and support, you can manage this condition and recover from it. How to cope with anxiety Dealing with stress Stress is your body's reaction to life changes and events, both good and bad. Stress can last just a few hours or it can be ongoing. Stress can play a major role in anxiety, so it is important to learn both how to cope with stress and how to think about it differently. Talk with your health care provider or a counselor to learn more about stress reduction. He or she may suggest some stress reduction techniques, such as:  Music therapy. This can include creating or listening to music that you enjoy and that inspires you.  Mindfulness-based meditation. This involves being aware of your normal breaths, rather than trying to control your breathing. It can  be done while sitting or walking.  Centering prayer. This is a kind of meditation that involves focusing on a word, phrase, or sacred image that is meaningful to you and that brings you peace.  Deep breathing. To do this, expand your stomach and inhale slowly through your nose. Hold your breath for 3-5 seconds. Then exhale slowly, allowing your stomach muscles to relax.  Self-talk. This is a skill where you identify thought patterns that lead to anxiety  reactions and correct those thoughts.  Muscle relaxation. This involves tensing muscles then relaxing them.  Choose a stress reduction technique that fits your lifestyle and personality. Stress reduction techniques take time and practice. Set aside 5-15 minutes a day to do them. Therapists can offer training in these techniques. The training may be covered by some insurance plans. Other things you can do to manage stress include:  Keeping a stress diary. This can help you learn what triggers your stress and ways to control your response.  Thinking about how you respond to certain situations. You may not be able to control everything, but you can control your reaction.  Making time for activities that help you relax, and not feeling guilty about spending your time in this way.  Therapy combined with coping and stress-reduction skills provides the best chance for successful treatment. Medicines Medicines can help ease symptoms. Medicines for anxiety include:  Anti-anxiety drugs.  Antidepressants.  Beta-blockers.  Medicines may be used as the main treatment for anxiety disorder, along with therapy, or if other treatments are not working. Medicines should be prescribed by a health care provider. Relationships Relationships can play a big part in helping you recover. Try to spend more time connecting with trusted friends and family members. Consider going to couples counseling, taking family education classes, or going to family therapy. Therapy can help you and others better understand the condition. How to recognize changes in your condition Everyone has a different response to treatment for anxiety. Recovery from anxiety happens when symptoms decrease and stop interfering with your daily activities at home or work. This may mean that you will start to:  Have better concentration and focus.  Sleep better.  Be less irritable.  Have more energy.  Have improved memory.  It is  important to recognize when your condition is getting worse. Contact your health care provider if your symptoms interfere with home or work and you do not feel like your condition is improving. Where to find help and support: You can get help and support from these sources:  Self-help groups.  Online and [employer].  A trusted spiritual leader.  Couples counseling.  Family education classes.  Family therapy.  Follow these instructions at home:  Eat a healthy diet that includes plenty of vegetables, fruits, whole grains, low-fat dairy products, and lean protein. Do not eat a lot of foods that are high in solid fats, added sugars, or salt.  Exercise. Most adults should do the following: ? Exercise for at least 150 minutes each week. The exercise should increase your heart rate and make you sweat (moderate-intensity exercise). ? Strengthening exercises at least twice a week.  Cut down on caffeine, tobacco, alcohol, and other potentially harmful substances.  Get the right amount and quality of sleep. Most adults need 7-9 hours of sleep each night.  Make choices that simplify your life.  Take over-the-counter and prescription medicines only as told by your health care provider.  Avoid caffeine, alcohol, and certain over-the-counter cold medicines. These  may make you feel worse. Ask your pharmacist which medicines to avoid.  Keep all follow-up visits as told by your health care provider. This is important. Questions to ask your health care provider  Would I benefit from therapy?  How often should I follow up with a health care provider?  How long do I need to take medicine?  Are there any long-term side effects of my medicine?  Are there any alternatives to taking medicine? Contact a health care provider if:  You have a hard time staying focused or finishing daily tasks.  You spend many hours a day feeling worried about everyday life.  You become exhausted  by worry.  You start to have headaches, feel tense, or have nausea.  You urinate more than normal.  You have diarrhea. Get help right away if:  You have a racing heart and shortness of breath.  You have thoughts of hurting yourself or others. If you ever feel like you may hurt yourself or others, or have thoughts about taking your own life, get help right away. You can go to your nearest emergency department or call:  Your local emergency services (911 in the [place]  A suicide crisis helpline, such as the National Suicide Prevention Lifeline at (249) 301-2129. This is open 24-hours a day.  Summary  Taking steps to deal with stress can help calm you.  Medicines cannot cure anxiety disorders, but they can help ease symptoms.  Family, friends, and partners can play a big part in helping you recover from an anxiety disorder. This information is not intended to replace advice given to you by your health care provider. Make sure you discuss any questions you have with your health care provider. Document Released: 11/27/2016 Document Revised: 11/27/2016 Document Reviewed: 11/27/2016 Elsevier Interactive Patient Education  [employer].

## 2018-10-14 NOTE — Addendum Note (Signed)
Addended by: Quay Burow on: 10/14/2018 11:36 AM   Modules accepted: Orders

## 2018-10-28 ENCOUNTER — Encounter: Payer: Self-pay | Admitting: Family Medicine

## 2018-10-28 ENCOUNTER — Ambulatory Visit (INDEPENDENT_AMBULATORY_CARE_PROVIDER_SITE_OTHER): Payer: Self-pay | Admitting: Family Medicine

## 2018-10-28 DIAGNOSIS — F411 Generalized anxiety disorder: Secondary | ICD-10-CM

## 2018-10-28 MED ORDER — SERTRALINE HCL 50 MG PO TABS: 50.0000 mg | ORAL_TABLET | Freq: Every day | ORAL | 6 refills | Status: DC

## 2018-10-28 NOTE — Patient Instructions (Signed)

## 2018-10-28 NOTE — Progress Notes (Signed)
  Subjective:     Maria Lin is a 32 y.o. female who presents for follow up of anxiety disorder. She reports she titrated the Zoloft up to 50 mg per day and is feeling much better. Denies dizziness, palpitations, or chest pain.  Current symptoms: fear of driving on the highway. States she is able to drive on side roads. She denies current suicidal and homicidal ideation. She complains of the following side effects from the treatment: none.  GAD 7 : Generalized Anxiety Score 10/28/2018 10/14/2018  Nervous, Anxious, on Edge 1 2  Control/stop worrying 0 2  Worry too much - different things 1 2  Trouble relaxing 0 3  Restless 0 1  Easily annoyed or irritable 0 3  Afraid - awful might happen 1 3  Total GAD 7 Score 3 16  Anxiety Difficulty Not difficult at all Very difficult      The following portions of the patient's history were reviewed and updated as appropriate: allergies, current medications, past family history, past medical history, past social history, past surgical history and problem list.    Objective:    BP 112/79   Pulse (!) 57   Temp 98.1 F (36.7 C) (Oral)   Ht 5\' 3"  (1.6 m)   Wt 189 lb (85.7 kg)   BMI 33.48 kg/m   General:  alert and cooperative  Affect/Behavior:  full facial expressions, good grooming, good insight, normal perception, normal reasoning, normal speech pattern and content and normal thought patterns none      Assessment:   1. Generalized anxiety disorder Improving with Zoloft, minimal symptoms. Will see psychiatry early December. Stress management, avoid triggers. Coping mechanisms reviewed.  - sertraline (ZOLOFT) 50 MG tablet; Take 1 tablet (50 mg total) by mouth daily.  Dispense: 30 tablet; Refill: 6   Plan:    Medications: Zoloft. Handouts describing disease, natural history, and treatment were given to the patient. Reviewed concept of anxiety as biochemical imbalance of neurotransmitters and rationale for treatment. Instructed  patient to contact office or on-call physician promptly should condition worsen or any new symptoms appear and provided on-call telephone numbers. IF THE PATIENT HAS ANY SUICIDAL OR HOMICIDAL IDEATIONS, CALL THE OFFICE, DISCUSS WITH A SUPPORT MEMBER, OR GO TO THE ER IMMEDIATELY. Patient was agreeable with this plan. Follow up: 6 months. or sooner for worsening or new symptoms.   She will keep her appointment with psychiatry.   The above assessment and management plan was discussed with the patient. The patient verbalized understanding of and has agreed to the management plan. Patient is aware to call the clinic if symptoms fail to improve or worsen. Patient is aware when to return to the clinic for a follow-up visit. Patient educated on when it is appropriate to go to the emergency department.   Kari Baars, FNP-C [facility] [address] [place], [place] [address] [phone]

## 2018-11-24 NOTE — Progress Notes (Deleted)
Psychiatric Initial Adult Assessment   Patient Identification: Maria Lin MRN:  [id number] Date of Evaluation:  11/24/2018 Referral Source: Sonny Masters, FNP Chief Complaint:   Visit Diagnosis: No diagnosis found.  History of Present Illness:   Maria Lin is a 32 y.o. year old female with a history of PTSD, anxiety, migraine, who is referred for PTSD.      Associated Signs/Symptoms: Depression Symptoms:  {DEPRESSION SYMPTOMS:20000} (Hypo) Manic Symptoms:  {BHH MANIC SYMPTOMS:22872} Anxiety Symptoms:  {BHH ANXIETY SYMPTOMS:22873} Psychotic Symptoms:  {BHH PSYCHOTIC SYMPTOMS:22874} PTSD Symptoms: {BHH PTSD SYMPTOMS:22875}  Past Psychiatric History:  Outpatient:  Psychiatry admission:  Previous suicide attempt:  Past trials of medication:  History of violence:   Previous Psychotropic Medications: {YES/NO:21197}  Substance Abuse History in the last 12 months:  {yes no:314532}  Consequences of Substance Abuse: {BHH CONSEQUENCES OF SUBSTANCE ABUSE:22880}  Past Medical History:  Past Medical History:  Diagnosis Date  . Depression    hx depression and pp depression  . Migraines   . PONV (postoperative nausea and vomiting)    with labor     Past Surgical History:  Procedure Laterality Date  . CESAREAN SECTION N/A 01/26/2013   Procedure: CESAREAN SECTION of baby girl   at 0613  APGAR 9/9;  Surgeon: Lavina Hamman, MD;  Location: [facility];  Service: Obstetrics;  Laterality: N/A;  . LAPAROSCOPIC TUBAL LIGATION Bilateral 02/09/2015   Procedure: LAPAROSCOPIC TUBAL LIGATION;  Surgeon: Oliver Pila, MD;  Location: [facility];  Service: Gynecology;  Laterality: Bilateral;  . NO PAST SURGERIES    . TUBAL LIGATION      Family Psychiatric History: ***  Family History:  Family History  Problem Relation Age of Onset  . Cancer Maternal Aunt        breast, colon  . Hypertension Maternal Aunt   . COPD Maternal Aunt   . Multiple sclerosis Maternal Aunt   .  Cancer Maternal Grandmother        cervical and ovarian  . Cancer Maternal Grandfather        colon  . Stroke Paternal Grandfather   . Heart disease Paternal Grandfather   . Heart attack Paternal Grandfather   . Hypertension Paternal Grandfather   . Diabetes Paternal Grandfather   . Other Father        allergic to novacaine  . Hypertension Father   . Heart attack Maternal Uncle   . Alcohol abuse Maternal Uncle   . Hypertension Paternal Grandmother   . Cancer Paternal Grandmother        bone    Social History:   Social History   Socioeconomic History  . Marital status: Married    Spouse name: Not on file  . Number of children: Not on file  . Years of education: Not on file  . Highest education level: Not on file  Occupational History  . Not on file  Social Needs  . Financial resource strain: Not on file  . Food insecurity:    Worry: Not on file    Inability: Not on file  . Transportation needs:    Medical: Not on file    Non-medical: Not on file  Tobacco Use  . Smoking status: Current Every Day Smoker    Packs/day: 0.10    Years: 19.00    Pack years: 1.90    Types: Cigarettes  . Smokeless tobacco: Never Used  Substance and Sexual Activity  . Alcohol use: Yes    Comment: social  .  Drug use: No  . Sexual activity: Yes  Lifestyle  . Physical activity:    Days per week: Not on file    Minutes per session: Not on file  . Stress: Not on file  Relationships  . Social connections:    Talks on phone: Not on file    Gets together: Not on file    Attends religious service: Not on file    Active member of club or organization: Not on file    Attends meetings of clubs or organizations: Not on file    Relationship status: Not on file  Other Topics Concern  . Not on file  Social History Narrative  . Not on file    Additional Social History: ***  Allergies:   Allergies  Allergen Reactions  . Penicillins Hives  . Codeine Nausea And Vomiting  . Vicodin  [Hydrocodone-Acetaminophen] Nausea And Vomiting    Metabolic Disorder Labs: No results found for: HGBA1C, MPG No results found for: PROLACTIN No results found for: CHOL, TRIG, HDL, CHOLHDL, VLDL, LDLCALC No results found for: TSH  Therapeutic Level Labs: No results found for: LITHIUM No results found for: CBMZ No results found for: VALPROATE  Current Medications: Current Outpatient Medications  Medication Sig Dispense Refill  . gabapentin (NEURONTIN) 300 MG capsule Take 300 mg by mouth 2 (two) times daily.    . meloxicam (MOBIC) 15 MG tablet Take 15 mg by mouth daily.    . sertraline (ZOLOFT) 50 MG tablet Take 1 tablet (50 mg total) by mouth daily. 30 tablet 6   No current facility-administered medications for this visit.     Musculoskeletal: Strength & Muscle Tone: within normal limits Gait & Station: normal Patient leans: N/A  Psychiatric Specialty Exam: ROS  There were no vitals taken for this visit.There is no height or weight on file to calculate BMI.  General Appearance: Fairly Groomed  Eye Contact:  Good  Speech:  Clear and Coherent  Volume:  Normal  Mood:  {BHH MOOD:22306}  Affect:  {Affect (PAA):22687}  Thought Process:  Coherent  Orientation:  Full (Time, Place, and Person)  Thought Content:  Logical  Suicidal Thoughts:  {ST/HT (PAA):22692}  Homicidal Thoughts:  {ST/HT (PAA):22692}  Memory:  Immediate;   Good  Judgement:  {Judgement (PAA):22694}  Insight:  {Insight (PAA):22695}  Psychomotor Activity:  Normal  Concentration:  Concentration: Good and Attention Span: Good  Recall:  Good  Fund of Knowledge:Good  Language: Good  Akathisia:  No  Handed:  Right  AIMS (if indicated):  not done  Assets:  Communication Skills Desire for Improvement  ADL's:  Intact  Cognition: WNL  Sleep:  {BHH GOOD/FAIR/POOR:22877}   Screenings: GAD-7     Office Visit from 10/28/2018 in [facility] Office Visit from 10/14/2018 in [facility]  Total GAD-7 Score  3  16    PHQ2-9     Office Visit from 10/28/2018 in [facility] Office Visit from 10/14/2018 in [facility]  PHQ-2 Total Score  4  0  PHQ-9 Total Score  9  -      Assessment and Plan:    Plan  The patient demonstrates the following risk factors for suicide: Chronic risk factors for suicide include: {Chronic Risk Factors for NWGNFAO:13086578}. Acute risk factors for suicide include: {Acute Risk Factors for IONGEXB:28413244}. Protective factors for this patient include: {Protective Factors for Suicide WNUU:72536644}. Considering these factors, the overall suicide risk at this point appears to  be {Desc; low/moderate/high:110033}. Patient {ACTION; IS/IS ZOX:[id number]}OT:21021397} appropriate for outpatient follow up.   Neysa Hottereina Gracin Soohoo, MD 12/9/201910:28 AM

## 2018-11-25 ENCOUNTER — Ambulatory Visit (HOSPITAL_COMMUNITY): Payer: Self-pay | Admitting: Psychiatry

## 2019-01-20 ENCOUNTER — Encounter: Payer: Self-pay | Admitting: Family Medicine

## 2019-01-20 ENCOUNTER — Ambulatory Visit (INDEPENDENT_AMBULATORY_CARE_PROVIDER_SITE_OTHER): Payer: Self-pay | Admitting: Family Medicine

## 2019-01-20 ENCOUNTER — Ambulatory Visit: Payer: Self-pay | Admitting: Family

## 2019-01-20 VITALS — BP 129/89 | HR 74 | Temp 97.6°F | Ht 63.0 in | Wt 195.0 lb

## 2019-01-20 DIAGNOSIS — J0110 Acute frontal sinusitis, unspecified: Secondary | ICD-10-CM

## 2019-01-20 MED ORDER — AZITHROMYCIN 250 MG PO TABS: ORAL_TABLET | ORAL | 0 refills | Status: DC

## 2019-01-20 NOTE — Progress Notes (Signed)
BP 129/89   Pulse 74   Temp 97.6 F (36.4 C) (Oral)   Ht 5\' 3"  (1.6 m)   Wt 195 lb (88.5 kg)   BMI 34.54 kg/m    Subjective:    Patient ID: Maria Lin, female    DOB: 22-Feb-1986, 33 y.o.   MRN: [id number]  HPI: Maria Lin is a 33 y.o. female presenting on 01/20/2019 for Sore Throat (x 3 days); Nasal Congestion; Generalized Body Aches; and Chills   HPI Sore throat nasal congestion body aches Sore throat is what brings the patient in today and it is been going on for 3 days.  She has been having some nasal pressure and some ear congestion and sinus pressure.  She also gets some postnasal drainage and then she has developed some body aches.  She denies any fevers but has had some chills.  She denies any shortness of breath or wheezing.  She has used some over-the-counter sinus medication without much success.  She denies any sick contacts that she knows of but she did work up until yesterday as a Copy in the hospital.  She is coming in today because she just feels like her sinus pressure and ear pressure has been getting worse.  Relevant past medical, surgical, family and social history reviewed and updated as indicated. Interim medical history since our last visit reviewed. Allergies and medications reviewed and updated.  Review of Systems  Constitutional: Negative for chills and fever.  HENT: Positive for congestion, postnasal drip, rhinorrhea, sinus pressure and sore throat. Negative for ear discharge, ear pain and sneezing.   Eyes: Negative for pain, redness and visual disturbance.  Respiratory: Positive for cough. Negative for chest tightness and shortness of breath.   Cardiovascular: Negative for chest pain and leg swelling.  Genitourinary: Negative for difficulty urinating and dysuria.  Musculoskeletal: Negative for back pain and gait problem.  Skin: Negative for rash.  Neurological: Negative for light-headedness and headaches.  Psychiatric/Behavioral:  Negative for agitation and behavioral problems.  All other systems reviewed and are negative.   Per HPI unless specifically indicated above   Allergies as of 01/20/2019      Reactions   Penicillins Hives   Codeine Nausea And Vomiting   Vicodin [hydrocodone-acetaminophen] Nausea And Vomiting      Medication List       Accurate as of January 20, 2019  3:27 PM. Always use your most recent med list.        azithromycin 250 MG tablet Commonly known as:  ZITHROMAX Take 2 the first day and then one each day after.   gabapentin 300 MG capsule Commonly known as:  NEURONTIN Take 300 mg by mouth 2 (two) times daily.   meloxicam 15 MG tablet Commonly known as:  MOBIC Take 15 mg by mouth daily.   sertraline 50 MG tablet Commonly known as:  ZOLOFT Take 1 tablet (50 mg total) by mouth daily.          Objective:    BP 129/89   Pulse 74   Temp 97.6 F (36.4 C) (Oral)   Ht 5\' 3"  (1.6 m)   Wt 195 lb (88.5 kg)   BMI 34.54 kg/m   Wt Readings from Last 3 Encounters:  01/20/19 195 lb (88.5 kg)  10/28/18 189 lb (85.7 kg)  10/14/18 191 lb (86.6 kg)    Physical Exam Vitals signs reviewed.  Constitutional:      General: She is not in acute distress.  Appearance: She is well-developed. She is not diaphoretic.  HENT:     Right Ear: Tympanic membrane, ear canal and external ear normal.     Left Ear: Tympanic membrane, ear canal and external ear normal.     Nose: Mucosal edema and rhinorrhea present.     Right Sinus: No maxillary sinus tenderness or frontal sinus tenderness.     Left Sinus: No maxillary sinus tenderness or frontal sinus tenderness.     Mouth/Throat:     Pharynx: Uvula midline. Posterior oropharyngeal erythema present. No oropharyngeal exudate.     Tonsils: No tonsillar abscesses.  Eyes:     Conjunctiva/sclera: Conjunctivae normal.  Cardiovascular:     Rate and Rhythm: Normal rate and regular rhythm.     Heart sounds: Normal heart sounds. No murmur.    Pulmonary:     Effort: Pulmonary effort is normal. No respiratory distress.     Breath sounds: Normal breath sounds. No wheezing or rales.  Musculoskeletal: Normal range of motion.        General: No tenderness.  Skin:    General: Skin is warm and dry.     Findings: No rash.  Neurological:     Mental Status: She is alert and oriented to person, place, and time.     Coordination: Coordination normal.  Psychiatric:        Behavior: Behavior normal.         Assessment & Plan:   Problem List Items Addressed This Visit    None    Visit Diagnoses    Acute non-recurrent frontal sinusitis    -  Primary   Relevant Medications   azithromycin (ZITHROMAX) 250 MG tablet       Follow up plan: Return if symptoms worsen or fail to improve.  Counseling provided for all of the vaccine components No orders of the defined types were placed in this encounter.   Arville Care, MD [facility] 01/20/2019, 3:27 PM

## 2019-04-28 ENCOUNTER — Ambulatory Visit: Payer: Self-pay | Admitting: Family Medicine

## 2019-06-01 ENCOUNTER — Other Ambulatory Visit: Payer: Self-pay

## 2019-06-01 ENCOUNTER — Encounter (INDEPENDENT_AMBULATORY_CARE_PROVIDER_SITE_OTHER): Payer: Self-pay

## 2019-06-01 ENCOUNTER — Telehealth: Payer: Self-pay | Admitting: *Deleted

## 2019-06-01 DIAGNOSIS — Z20822 Contact with and (suspected) exposure to covid-19: Secondary | ICD-10-CM

## 2019-06-01 NOTE — Telephone Encounter (Signed)
BPA received for worsening cough via MyChart COVID symptom monitoring. Attempted to call pt to discuss symptoms but no answer at this time. Left voicemail message for pt to return call or to call PCP if symptoms do not improve with over the counter medication.

## 2019-06-02 ENCOUNTER — Encounter (INDEPENDENT_AMBULATORY_CARE_PROVIDER_SITE_OTHER): Payer: Self-pay

## 2019-06-03 LAB — NOVEL CORONAVIRUS, NAA: SARS-CoV-2, NAA: NOT DETECTED

## 2019-11-17 ENCOUNTER — Other Ambulatory Visit: Payer: Self-pay

## 2019-11-17 DIAGNOSIS — Z20822 Contact with and (suspected) exposure to covid-19: Secondary | ICD-10-CM

## 2019-11-19 LAB — NOVEL CORONAVIRUS, NAA: SARS-CoV-2, NAA: NOT DETECTED
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
## 2020-09-21 ENCOUNTER — Ambulatory Visit (INDEPENDENT_AMBULATORY_CARE_PROVIDER_SITE_OTHER): Payer: BC Managed Care – PPO | Admitting: Family Medicine

## 2020-09-21 ENCOUNTER — Encounter: Payer: Self-pay | Admitting: Family Medicine

## 2020-09-21 DIAGNOSIS — R509 Fever, unspecified: Secondary | ICD-10-CM | POA: Diagnosis not present

## 2020-09-21 NOTE — Progress Notes (Signed)
   Virtual Visit via Telephone Note  I connected with Maria Lin on 09/21/20 at 2:34 PM by telephone and verified that I am speaking with the correct person using two identifiers. Maria Lin is currently located in her car and nobody is currently with her during this visit. The provider, Gwenlyn Fudge, FNP is located in their home at time of visit.  I discussed the limitations, risks, security and privacy concerns of performing an evaluation and management service by telephone and the availability of in person appointments. I also discussed with the patient that there may be a patient responsible charge related to this service. The patient expressed understanding and agreed to proceed.  Subjective: PCP: Sonny Masters, FNP (Inactive)  Chief Complaint  Patient presents with  . Fever   Patient reports she received her second COVID-19 vaccine Monday evening and by that night she was hurting all over.  In the middle of the night she started vomiting and developed a fever yesterday.  Max temp 102.9.  She reports the fever finally broke today.  Patient does work at a school and reports they are requesting she be tested for COVID-19 before returning to work.   ROS: Per HPI  Current Outpatient Medications:  .  gabapentin (NEURONTIN) 300 MG capsule, Take 300 mg by mouth 2 (two) times daily., Disp: , Rfl:  .  meloxicam (MOBIC) 15 MG tablet, Take 15 mg by mouth daily., Disp: , Rfl:  .  sertraline (ZOLOFT) 50 MG tablet, Take 1 tablet (50 mg total) by mouth daily., Disp: 30 tablet, Rfl: 6  Allergies  Allergen Reactions  . Penicillins Hives  . Codeine Nausea And Vomiting  . Vicodin [Hydrocodone-Acetaminophen] Nausea And Vomiting   Past Medical History:  Diagnosis Date  . Depression    hx depression and pp depression  . Migraines   . PONV (postoperative nausea and vomiting)    with labor     Observations/Objective: A&O  No respiratory distress or wheezing audible over the  phone Mood, judgement, and thought processes all WNL  Assessment and Plan: 1. Fever, unspecified fever cause - Symptoms appear to be related to receiving the second COVID-19 vaccine.  Reassurance provided.  Discussed symptom management.  A note was provided for work until she can get her COVID-19 results back. - Novel Coronavirus, NAA ([facility]); Future   Follow Up Instructions:  I discussed the assessment and treatment plan with the patient. The patient was provided an opportunity to ask questions and all were answered. The patient agreed with the plan and demonstrated an understanding of the instructions.   The patient was advised to call back or seek an in-person evaluation if the symptoms worsen or if the condition fails to improve as anticipated.  The above assessment and management plan was discussed with the patient. The patient verbalized understanding of and has agreed to the management plan. Patient is aware to call the clinic if symptoms persist or worsen. Patient is aware when to return to the clinic for a follow-up visit. Patient educated on when it is appropriate to go to the emergency department.   Time call ended: 2:44 PM  I provided 12 minutes of non-face-to-face time during this encounter.  Deliah Boston, MSN, APRN, FNP-C [facility] 09/21/20

## 2020-09-22 LAB — SARS-COV-2, NAA 2 DAY TAT

## 2020-09-22 LAB — NOVEL CORONAVIRUS, NAA: SARS-CoV-2, NAA: NOT DETECTED

## 2021-02-20 ENCOUNTER — Ambulatory Visit (INDEPENDENT_AMBULATORY_CARE_PROVIDER_SITE_OTHER): Payer: BC Managed Care – PPO | Admitting: Family Medicine

## 2021-02-20 ENCOUNTER — Encounter: Payer: Self-pay | Admitting: Family Medicine

## 2021-02-20 DIAGNOSIS — J029 Acute pharyngitis, unspecified: Secondary | ICD-10-CM

## 2021-02-20 DIAGNOSIS — R509 Fever, unspecified: Secondary | ICD-10-CM

## 2021-02-20 LAB — RAPID STREP SCREEN (MED CTR MEBANE ONLY): Strep Gp A Ag, IA W/Reflex: NEGATIVE

## 2021-02-20 LAB — VERITOR FLU A/B WAIVED
Influenza A: NEGATIVE
Influenza B: NEGATIVE

## 2021-02-20 LAB — CULTURE, GROUP A STREP

## 2021-02-20 MED ORDER — AZITHROMYCIN 250 MG PO TABS: ORAL_TABLET | ORAL | 0 refills | Status: DC

## 2021-02-20 NOTE — Addendum Note (Signed)
Addended by: Gabriel Earing on: 02/20/2021 12:01 PM   Modules accepted: Orders

## 2021-02-20 NOTE — Progress Notes (Addendum)
Virtual Visit via telephone Note Due to COVID-19 pandemic this visit was conducted virtually. This visit type was conducted due to national recommendations for restrictions regarding the COVID-19 Pandemic (e.g. social distancing, sheltering in place) in an effort to limit this patient's exposure and mitigate transmission in our community. All issues noted in this document were discussed and addressed.  A physical exam was not performed with this format.  I connected with Maria Lin on 02/20/21 at 1105 by telephone and verified that I am speaking with the correct person using two identifiers. Maria Lin is currently located at home and no one is currently with her during the visit. The provider, Gabriel Earing, FNP is located in their office at time of visit.  I discussed the limitations, risks, security and privacy concerns of performing an evaluation and management service by telephone and the availability of in person appointments. I also discussed with the patient that there may be a patient responsible charge related to this service. The patient expressed understanding and agreed to proceed.  CC: sore throat  History and Present Illness:  HPI  Maria Lin reports a sore throat that started last night. It is painful to swollen. She reports that her tonsils look very swollen. She does not see any exudate. The lymph nodes in her neck are tender. She did have a fever of 101 this morning. She does have a mild cough and nasal congestion. Her daughter was tested for strep on Saturday but it was a send off test and they are still waiting for the results. She had Covid about 6 weeks ago. She is taking tylenol. Denies nausea, vomiting, shortness of breath, or chest pain.    ROS As per HPI.   Observations/Objective: Alert and oriented x 3. Able to speak in full sentences without difficulty.   Assessment and Plan: [name] was seen today for sore throat.  Diagnoses and all orders for  this visit:  Fever, unspecified fever cause Tylenol as needed for fever. Negative rapid strep and flu.  -     Veritor Flu A/B Waived -     Rapid Strep Screen ([facility] ONLY)  Sore throat Negative rapid strep and flu. Discussed likely viral pharyngitis, however I will send in a Z-pak for bacterial coverage due to enlarged tonsils. Tylenol, throat lozenges, chloraseptic spray as needed. Push fluids.  -     Veritor Flu A/B Waived -     Rapid Strep Screen ([facility] ONLY) -     azithromycin (ZITHROMAX Z-PAK) 250 MG tablet; As directed   Follow Up Instructions: Return to office for new or worsening symptoms, or if symptoms persist.      I discussed the assessment and treatment plan with the patient. The patient was provided an opportunity to ask questions and all were answered. The patient agreed with the plan and demonstrated an understanding of the instructions.   The patient was advised to call back or seek an in-person evaluation if the symptoms worsen or if the condition fails to improve as anticipated.  The above assessment and management plan was discussed with the patient. The patient verbalized understanding of and has agreed to the management plan. Patient is aware to call the clinic if symptoms persist or worsen. Patient is aware when to return to the clinic for a follow-up visit. Patient educated on when it is appropriate to go to the emergency department.   Time call ended:  1115  I provided 10 minutes  of phone time during this encounter.    Gabriel Earing, FNP

## 2023-10-21 DIAGNOSIS — R0602 Shortness of breath: Secondary | ICD-10-CM | POA: Diagnosis not present

## 2023-10-21 DIAGNOSIS — J988 Other specified respiratory disorders: Secondary | ICD-10-CM | POA: Diagnosis not present

## 2023-10-21 DIAGNOSIS — Z20822 Contact with and (suspected) exposure to covid-19: Secondary | ICD-10-CM | POA: Diagnosis not present

## 2023-10-21 DIAGNOSIS — B9689 Other specified bacterial agents as the cause of diseases classified elsewhere: Secondary | ICD-10-CM | POA: Diagnosis not present

## 2023-10-21 DIAGNOSIS — R051 Acute cough: Secondary | ICD-10-CM | POA: Diagnosis not present

## 2023-11-07 ENCOUNTER — Telehealth: Payer: Self-pay | Admitting: Family Medicine

## 2023-11-07 NOTE — Telephone Encounter (Signed)
Appt scheduled for 03/25/24 with Marcelino Duster for CPE

## 2023-11-07 NOTE — Telephone Encounter (Signed)
Can we get a new patient appt scheduled please    Copied from CRM [id number]. Topic: General - Other >> Nov 07, 2023  9:49 AM Prudencio Pair wrote: Reason for CRM: Patient just got medicaid and has not been seen by a doctor in over 10 years. Insurance has her new pcp as Dr. Reginia Forts. Pt is wanting be seen for a complete check up and speak with provider about her overall health. Advised pt that provider would have to "ok" her as a new pt and then nurse will reach out to get her scheduled. Pt's CB # [phone].

## 2024-01-28 DIAGNOSIS — Z20822 Contact with and (suspected) exposure to covid-19: Secondary | ICD-10-CM | POA: Diagnosis not present

## 2024-01-28 DIAGNOSIS — J101 Influenza due to other identified influenza virus with other respiratory manifestations: Secondary | ICD-10-CM | POA: Diagnosis not present

## 2024-01-28 DIAGNOSIS — R059 Cough, unspecified: Secondary | ICD-10-CM | POA: Diagnosis not present

## 2024-01-28 DIAGNOSIS — R110 Nausea: Secondary | ICD-10-CM | POA: Diagnosis not present

## 2024-03-18 DIAGNOSIS — H5213 Myopia, bilateral: Secondary | ICD-10-CM | POA: Diagnosis not present

## 2024-03-25 ENCOUNTER — Ambulatory Visit: Payer: BC Managed Care – PPO | Admitting: Family Medicine

## 2024-03-25 ENCOUNTER — Encounter: Payer: Self-pay | Admitting: Family Medicine

## 2024-03-25 VITALS — BP 135/82 | HR 80 | Temp 98.0°F | Ht 63.0 in | Wt 234.0 lb

## 2024-03-25 DIAGNOSIS — F411 Generalized anxiety disorder: Secondary | ICD-10-CM

## 2024-03-25 DIAGNOSIS — Z803 Family history of malignant neoplasm of breast: Secondary | ICD-10-CM

## 2024-03-25 DIAGNOSIS — R635 Abnormal weight gain: Secondary | ICD-10-CM

## 2024-03-25 DIAGNOSIS — Z1239 Encounter for other screening for malignant neoplasm of breast: Secondary | ICD-10-CM

## 2024-03-25 DIAGNOSIS — R239 Unspecified skin changes: Secondary | ICD-10-CM

## 2024-03-25 DIAGNOSIS — Z6841 Body Mass Index (BMI) 40.0 and over, adult: Secondary | ICD-10-CM | POA: Insufficient documentation

## 2024-03-25 DIAGNOSIS — N946 Dysmenorrhea, unspecified: Secondary | ICD-10-CM

## 2024-03-25 DIAGNOSIS — F339 Major depressive disorder, recurrent, unspecified: Secondary | ICD-10-CM

## 2024-03-25 DIAGNOSIS — M7989 Other specified soft tissue disorders: Secondary | ICD-10-CM

## 2024-03-25 LAB — BAYER DCA HB A1C WAIVED: HB A1C (BAYER DCA - WAIVED): 4.9 % (ref 4.8–5.6)

## 2024-03-25 MED ORDER — SERTRALINE HCL 50 MG PO TABS: 50.0000 mg | ORAL_TABLET | Freq: Every day | ORAL | 2 refills | Status: DC

## 2024-03-25 NOTE — Patient Instructions (Signed)
 Goal BP:  For patients younger than 60: Goal BP < 140/90. For patients 60 and older: Goal BP < 150/90. For patients with diabetes: Goal BP < 140/90.  Take your medications faithfully as prescribed. Maintain a healthy weight. Get at least 150 minutes of aerobic exercise per week. Minimize salt intake, less than 2000 mg per day. Minimize alcohol intake.  DASH Eating Plan DASH stands for "Dietary Approaches to Stop Hypertension." The DASH eating plan is a healthy eating plan that has been shown to reduce high blood pressure (hypertension). Additional health benefits may include reducing the risk of type 2 diabetes mellitus, heart disease, and stroke. The DASH eating plan may also help with weight loss.  WHAT DO I NEED TO KNOW ABOUT THE DASH EATING PLAN? For the DASH eating plan, you will follow these general guidelines: Choose foods with a percent daily value for sodium of less than 5% (as listed on the food label). Use salt-free seasonings or herbs instead of table salt or sea salt. Check with your health care provider or pharmacist before using salt substitutes. Eat lower-sodium products, often labeled as "lower sodium" or "no salt added." Eat fresh foods. Eat more vegetables, fruits, and low-fat dairy products. Choose whole grains. Look for the word "whole" as the first word in the ingredient list. Choose fish and skinless chicken or [place] more often than red meat. Limit fish, poultry, and meat to 6 oz (170 g) each day. Limit sweets, desserts, sugars, and sugary drinks. Choose heart-healthy fats. Limit cheese to 1 oz (28 g) per day. Eat more home-cooked food and less restaurant, buffet, and fast food. Limit fried foods. Cook foods using methods other than frying. Limit canned vegetables. If you do use them, rinse them well to decrease the sodium. When eating at a restaurant, ask that your food be prepared with less salt, or no salt if possible.  WHAT FOODS CAN I EAT? Seek help from  a dietitian for individual calorie needs.  Grains Whole grain or whole wheat bread. Brown rice. Whole grain or whole wheat pasta. Quinoa, bulgur, and whole grain cereals. Low-sodium cereals. Corn or whole wheat flour tortillas. Whole grain cornbread. Whole grain crackers. Low-sodium crackers.  Vegetables Fresh or frozen vegetables (raw, steamed, roasted, or grilled). Low-sodium or reduced-sodium tomato and vegetable juices. Low-sodium or reduced-sodium tomato sauce and paste. Low-sodium or reduced-sodium canned vegetables.   Fruits All fresh, canned (in natural juice), or frozen fruits.  Meat and Other Protein Products Ground beef (85% or leaner), grass-fed beef, or beef trimmed of fat. Skinless chicken or [place]. Ground chicken or [place]. Pork trimmed of fat. All fish and seafood. Eggs. Dried beans, peas, or lentils. Unsalted nuts and seeds. Unsalted canned beans.  Dairy Low-fat dairy products, such as skim or 1% milk, 2% or reduced-fat cheeses, low-fat ricotta or cottage cheese, or plain low-fat yogurt. Low-sodium or reduced-sodium cheeses.  Fats and Oils Tub margarines without trans fats. Light or reduced-fat mayonnaise and salad dressings (reduced sodium). Avocado. Safflower, olive, or canola oils. Natural peanut or almond butter.  Other Unsalted popcorn and pretzels. The items listed above may not be a complete list of recommended foods or beverages. Contact your dietitian for more options.  WHAT FOODS ARE NOT RECOMMENDED?  Grains White bread. White pasta. White rice. Refined cornbread. Bagels and croissants. Crackers that contain trans fat.  Vegetables Creamed or fried vegetables. Vegetables in a cheese sauce. Regular canned vegetables. Regular canned tomato sauce and paste. Regular tomato and vegetable juices.  Fruits Dried fruits. Canned fruit in light or heavy syrup. Fruit juice.  Meat and Other Protein Products Fatty cuts of meat. Ribs, chicken wings, bacon, sausage,  bologna, salami, chitterlings, fatback, hot dogs, bratwurst, and packaged luncheon meats. Salted nuts and seeds. Canned beans with salt.  Dairy Whole or 2% milk, cream, half-and-half, and cream cheese. Whole-fat or sweetened yogurt. Full-fat cheeses or blue cheese. Nondairy creamers and whipped toppings. Processed cheese, cheese spreads, or cheese curds.  Condiments Onion and garlic salt, seasoned salt, table salt, and sea salt. Canned and packaged gravies. Worcestershire sauce. Tartar sauce. Barbecue sauce. Teriyaki sauce. Soy sauce, including reduced sodium. Steak sauce. Fish sauce. Oyster sauce. Cocktail sauce. Horseradish. Ketchup and mustard. Meat flavorings and tenderizers. Bouillon cubes. Hot sauce. Tabasco sauce. Marinades. Taco seasonings. Relishes.  Fats and Oils Butter, stick margarine, lard, shortening, ghee, and bacon fat. Coconut, palm kernel, or palm oils. Regular salad dressings.  Other Pickles and olives. Salted popcorn and pretzels.  The items listed above may not be a complete list of foods and beverages to avoid. Contact your dietitian for more information.  WHERE CAN I FIND MORE INFORMATION? National [employer], [employer], and [employer] Institute: [email or web address] Document Released: 11/22/2011 Document Revised: 04/19/2014 Document Reviewed: 10/07/2013 [facility] Patient Information 2015 [place], [place]. This information is not intended to replace advice given to you by your health care provider. Make sure you discuss any questions you have with your health care provider.   I think that you would greatly benefit from seeing a nutritionist.  If you are interested, please call Dr. Gerilyn Pilgrim at [phone] to schedule an appointment.

## 2024-03-25 NOTE — Progress Notes (Signed)
 Subjective:  Patient ID: Maria Lin, female    DOB: 1986/04/11, 38 y.o.   MRN: [id number]  Patient Care Team: Sonny Masters, FNP as PCP - General (Family Medicine) Huel Cote, MD as Consulting Physician (Obstetrics and Gynecology)   Chief Complaint:  New Patient (Initial Visit) ([facility] patient over 3 years ago- family member dx with breast cancer so she would like to get checked. ), Joint Swelling (Bilateral ankle swelling x 2 1/2 weeks ), and Hypertension (X 2 months )   HPI: Maria Lin is a 38 y.o. female presenting on 03/25/2024 for New Patient (Initial Visit) ([facility] patient over 3 years ago- family member dx with breast cancer so she would like to get checked. ), Joint Swelling (Bilateral ankle swelling x 2 1/2 weeks ), and Hypertension (X 2 months )   Discussed the use of AI scribe software for clinical note transcription with the patient, who gave verbal consent to proceed.  History of Present Illness   Maria Lin is a 38 year old female who presents to reestablish primary care with concerns of ankle swelling and family history of breast cancer.  She experiences significant swelling in her ankles, particularly around the sock line, which has been more pronounced over the past year or two. The swelling is tender and sometimes turns purple above the sock line after work. She has not tried compression hose yet and mentions a family history of poor circulation in her father.  There is a significant family history of breast cancer, with both of her father's sisters diagnosed at young ages and her aunt recently diagnosed with a rare form of breast cancer. She experiences breast pain, described as shooting pain down the side and around the outer ring, with redness and tenderness of the nipples, which has been present since January. She performs self-breast exams and recalls a past thyroid-related lump on the left side.  She has a history of endometriosis, noted  during a C-section, and experiences severe pelvic pain, particularly before her menstrual period. Her periods last 3-4 days but are very heavy, especially on the second day. She has not seen a gynecologist since her daughter's birth.  She has a history of anxiety and depression, initially treated with Zoloft after her daughter's birth, but she has not taken it for many years. She experiences daily shortness of breath, dizziness, and palpitations, which she attributes to anxiety.  She reports a significant weight gain over the past year, despite regular physical activity, including walking 2-3 miles several times a week and working at [employer], which involves constant movement. She drinks a substantial amount of water daily and takes a multivitamin. Her blood pressure at home has been elevated, around 150, which is higher than her usual readings.  She has a history of plantar fasciitis diagnosed in 2014, with ongoing heel pain despite wearing supportive footwear.          Relevant past medical, surgical, family, and social history reviewed and updated as indicated.  Allergies and medications reviewed and updated. Data reviewed: Chart in Epic.   Past Medical History:  Diagnosis Date   Anxiety    Depression    hx depression and pp depression   Migraines    PONV (postoperative nausea and vomiting)    with labor     Past Surgical History:  Procedure Laterality Date   CESAREAN SECTION N/A 01/26/2013   Procedure: CESAREAN SECTION of baby girl   at [id number]  APGAR 9/9;  Surgeon: Lavina Hamman, MD;  Location: [facility];  Service: Obstetrics;  Laterality: N/A;   LAPAROSCOPIC TUBAL LIGATION Bilateral 02/09/2015   Procedure: LAPAROSCOPIC TUBAL LIGATION;  Surgeon: Oliver Pila, MD;  Location: [facility];  Service: Gynecology;  Laterality: Bilateral;   NO PAST SURGERIES     TUBAL LIGATION      Social History   Socioeconomic History   Marital status: Married    Spouse name: Not on file    Number of children: Not on file   Years of education: Not on file   Highest education level: 12th grade  Occupational History   Not on file  Tobacco Use   Smoking status: Some Days    Current packs/day: 0.10    Average packs/day: 0.1 packs/day for 19.0 years (1.9 ttl pk-yrs)    Types: Cigarettes   Smokeless tobacco: Never  Vaping Use   Vaping status: Never Used  Substance and Sexual Activity   Alcohol use: Yes    Comment: social   Drug use: No   Sexual activity: Yes  Other Topics Concern   Not on file  Social History Narrative   Not on file   Social Drivers of Health   Financial Resource Strain: Medium Risk (03/25/2024)   Overall Financial Resource Strain (CARDIA)    Difficulty of Paying Living Expenses: Somewhat hard  Food Insecurity: Food Insecurity Present (03/25/2024)   Hunger Vital Sign    Worried About Running Out of Food in the Last Year: Sometimes true    Ran Out of Food in the Last Year: Sometimes true  Transportation Needs: No Transportation Needs (03/25/2024)   PRAPARE - Administrator, Civil Service (Medical): No    Lack of Transportation (Non-Medical): No  Physical Activity: Insufficiently Active (03/25/2024)   Exercise Vital Sign    Days of Exercise per Week: 5 days    Minutes of Exercise per Session: 10 min  Stress: Stress Concern Present (03/25/2024)   [employer] of Occupational Health - Occupational Stress Questionnaire    Feeling of Stress : Very much  Social Connections: Socially Isolated (03/25/2024)   Social Connection and Isolation Panel [NHANES]    Frequency of Communication with Friends and Family: Once a week    Frequency of Social Gatherings with Friends and Family: Never    Attends Religious Services: Never    Database administrator or Organizations: No    Attends Engineer, structural: Not on file    Marital Status: Living with partner  Intimate Partner Violence: Not on file    Outpatient Encounter Medications as of  03/25/2024  Medication Sig   sertraline (ZOLOFT) 50 MG tablet Take 1 tablet (50 mg total) by mouth daily. Take 1/2 tablet for 6 days and then increase to 1 tablet daily.   [DISCONTINUED] azithromycin (ZITHROMAX Z-PAK) 250 MG tablet As directed   [DISCONTINUED] gabapentin (NEURONTIN) 300 MG capsule Take 300 mg by mouth 2 (two) times daily.   [DISCONTINUED] meloxicam (MOBIC) 15 MG tablet Take 15 mg by mouth daily.   [DISCONTINUED] sertraline (ZOLOFT) 50 MG tablet Take 1 tablet (50 mg total) by mouth daily.   No facility-administered encounter medications on file as of 03/25/2024.    Allergies  Allergen Reactions   Codeine Nausea And Vomiting   Penicillins Hives   Vicodin [Hydrocodone-Acetaminophen] Nausea And Vomiting    Pertinent ROS per HPI, otherwise unremarkable      Objective:  BP 135/82   Pulse 80  Temp 98 F (36.7 C)   Ht 5\' 3"  (1.6 m)   Wt 234 lb (106.1 kg)   LMP 03/20/2024 (Exact Date)   SpO2 99%   BMI 41.45 kg/m    Wt Readings from Last 3 Encounters:  03/25/24 234 lb (106.1 kg)  01/20/19 195 lb (88.5 kg)  10/28/18 189 lb (85.7 kg)    Physical Exam Vitals and nursing note reviewed.  Constitutional:      Appearance: Normal appearance. She is morbidly obese.  HENT:     Head: Normocephalic and atraumatic.     Nose: Nose normal.     Mouth/Throat:     Mouth: Mucous membranes are moist.  Eyes:     General: Lids are normal.     Conjunctiva/sclera: Conjunctivae normal.     Pupils: Pupils are equal, round, and reactive to light.  Neck:     Trachea: Trachea and phonation normal.  Cardiovascular:     Rate and Rhythm: Normal rate and regular rhythm.     Pulses: Normal pulses.     Heart sounds: Normal heart sounds.  Pulmonary:     Effort: Pulmonary effort is normal.     Breath sounds: Normal breath sounds.  Musculoskeletal:     Cervical back: Neck supple.     Right lower leg: No edema.     Left lower leg: No edema.  Lymphadenopathy:     Cervical: No cervical  adenopathy.  Skin:    General: Skin is warm and dry.     Capillary Refill: Capillary refill takes less than 2 seconds.  Neurological:     General: No focal deficit present.     Mental Status: She is alert and oriented to person, place, and time.  Psychiatric:        Mood and Affect: Mood normal.        Behavior: Behavior normal. Behavior is cooperative.        Thought Content: Thought content normal.        Judgment: Judgment normal.       Results for orders placed or performed in visit on 02/20/21  Rapid Strep Screen ([facility] ONLY)   Collection Time: 02/20/21 11:28 AM   Specimen: Other   Other  Result Value Ref Range   Strep Gp A Ag, IA W/Reflex Negative Negative  Culture, Group A Strep   Collection Time: 02/20/21 11:28 AM   Other  Result Value Ref Range   Strep A Culture CANCELED   Veritor Flu A/B Waived   Collection Time: 02/20/21 11:28 AM  Result Value Ref Range   Influenza A Negative Negative   Influenza B Negative Negative       Pertinent labs & imaging results that were available during my care of the patient were reviewed by me and considered in my medical decision making.  Assessment & Plan:  Sya was seen today for new patient (initial visit), joint swelling and hypertension.  Diagnoses and all orders for this visit:  Family history of breast cancer in female -     MM 3D SCREENING MAMMOGRAM BILATERAL BREAST  Breast cancer screening, high risk patient -     MM 3D SCREENING MAMMOGRAM BILATERAL BREAST  Changes in skin of both breasts -     MM 3D SCREENING MAMMOGRAM BILATERAL BREAST  Generalized anxiety disorder -     Anemia Profile B -     CMP14+EGFR -     Thyroid Panel With TSH -     VITAMIN  D 25 Hydroxy (Vit-D Deficiency, Fractures) -     sertraline (ZOLOFT) 50 MG tablet; Take 1 tablet (50 mg total) by mouth daily. Take 1/2 tablet for 6 days and then increase to 1 tablet daily.  Depression, recurrent (HCC) -     Anemia Profile B -      CMP14+EGFR -     Thyroid Panel With TSH -     VITAMIN D 25 Hydroxy (Vit-D Deficiency, Fractures) -     sertraline (ZOLOFT) 50 MG tablet; Take 1 tablet (50 mg total) by mouth daily. Take 1/2 tablet for 6 days and then increase to 1 tablet daily.  BMI 40.0-44.9, adult (HCC) -     Anemia Profile B -     CMP14+EGFR -     Lipid panel -     Thyroid Panel With TSH -     VITAMIN D 25 Hydroxy (Vit-D Deficiency, Fractures) -     Bayer DCA Hb A1c Waived  Weight gain -     Anemia Profile B -     CMP14+EGFR -     Lipid panel -     Thyroid Panel With TSH -     VITAMIN D 25 Hydroxy (Vit-D Deficiency, Fractures) -     Bayer DCA Hb A1c Waived  Leg swelling -     Anemia Profile B -     CMP14+EGFR -     Thyroid Panel With TSH  Dysmenorrhea -     Ambulatory referral to Obstetrics / Gynecology     Assessment and Plan    Breast Cancer Risk Family history of breast cancer with multiple relatives affected, including aunts diagnosed at a young age. Symptoms include breast pain, redness, and purplish discoloration since January. High risk due to family history and potential genetic predisposition. Discussed genetic testing for BRCA gene and other markers. Recommended mammogram due to high-risk status despite being under 40. Emphasized genetic counseling to determine specific genetic risks and potential preventive measures such as prophylactic mastectomy if BRCA positive. - Order mammogram - Refer to genetic counseling for BRCA and other genetic testing - Recommend follow-up at [facility] for comprehensive imaging  Dysmenorrhea and Suspected Endometriosis Severe menstrual pain, described as a vice grip on the lower abdomen, with endometriosis noted during C-section. Symptoms exacerbated by anxiety and depression. No recent gynecological evaluation since childbirth. Discussed need for gynecological evaluation to assess and manage symptoms. - Refer to gynecology for evaluation and  management of dysmenorrhea and endometriosis  Anxiety and Depression Anxiety and depression, previously treated with Zoloft. Symptoms include shortness of breath, palpitations, dizziness, and weight gain. Anxiety and depression likely contributing to physical symptoms and weight gain. Educated on treatment importance and medication role in managing symptoms. Explained untreated anxiety and depression can exacerbate physical symptoms and medication is necessary to regulate brain chemicals. - Prescribe sertraline (Zoloft) 50 mg, start with half tablet for 6 days, then increase to full tablet - Schedule follow-up in 4-6 weeks to assess response to medication  Hypertension Elevated blood pressure readings at home, with normal readings during visit. Possible contributing factors include weight gain and anxiety. Discussed importance of monitoring blood pressure at home and potential need for medication if lifestyle changes do not improve readings. - Monitor blood pressure at home and keep a log - Bring home blood pressure cuff to next appointment for calibration - Schedule follow-up in 4-6 weeks to reassess blood pressure and weight  Ankle Edema Bilateral ankle swelling, more pronounced recently,  possibly related to prolonged standing at work. Differential includes venous insufficiency, renal dysfunction, thyroid dysfunction, anemia, or excessive salt intake. No shortness of breath or chest pain reported. Family history of poor circulation in father. Discussed importance of lab work to rule out underlying causes and potential benefit of compression hose if labs are normal. - Order lab work to assess kidney function, thyroid function, anemia profile, and protein levels - Advise use of compression hose if labs are normal - Monitor salt intake and reduce if excessive  General Health Maintenance Reestablishing care with no recent lab work since 2018. Discussed i mportance of regular health screenings and  monitoring to identify any underlying health issues. - Order comprehensive lab work including thyroid function, anemia profile, liver and kidney functions, and cholesterol levels  Follow-up Follow-up needed to reassess multiple health concerns and evaluate response to treatment. Discussed importance of monitoring blood pressure and weight, and ensuring completion of mammogram and genetic counseling appointments. - Schedule follow-up appointment in 4-6 weeks - Review lab results and adjust treatment plan as necessary - Ensure mammogram and genetic counseling appointments are completed          Continue all other maintenance medications.  Follow up plan: Return in about 6 weeks (around 05/06/2024), or if symptoms worsen or fail to improve, for HTN, BMI, Depression, Anxiety.   Continue healthy lifestyle choices, including diet (rich in fruits, vegetables, and lean proteins, and low in salt and simple carbohydrates) and exercise (at least 30 minutes of moderate physical activity daily).  Educational handout given for DASH diet, HTN  The above assessment and management plan was discussed with the patient. The patient verbalized understanding of and has agreed to the management plan. Patient is aware to call the clinic if they develop any new symptoms or if symptoms persist or worsen. Patient is aware when to return to the clinic for a follow-up visit. Patient educated on when it is appropriate to go to the emergency department.   Kari Baars, FNP-C [facility] [phone]

## 2024-03-26 LAB — CMP14+EGFR
ALT: 13 IU/L (ref 0–32)
AST: 19 IU/L (ref 0–40)
Albumin: 4.1 g/dL (ref 3.9–4.9)
Alkaline Phosphatase: 73 IU/L (ref 44–121)
BUN/Creatinine Ratio: 18 (ref 9–23)
BUN: 10 mg/dL (ref 6–20)
Bilirubin Total: 0.4 mg/dL (ref 0.0–1.2)
CO2: 23 mmol/L (ref 20–29)
Calcium: 9.3 mg/dL (ref 8.7–10.2)
Chloride: 105 mmol/L (ref 96–106)
Creatinine, Ser: 0.57 mg/dL (ref 0.57–1.00)
Globulin, Total: 2.5 g/dL (ref 1.5–4.5)
Glucose: 114 mg/dL — ABNORMAL HIGH (ref 70–99)
Potassium: 4.1 mmol/L (ref 3.5–5.2)
Sodium: 140 mmol/L (ref 134–144)
Total Protein: 6.6 g/dL (ref 6.0–8.5)
eGFR: 121 mL/min/{1.73_m2} (ref >59)

## 2024-03-26 LAB — ANEMIA PROFILE B
Basophils Absolute: 0 10*3/uL (ref 0.0–0.2)
Basos: 1 %
EOS (ABSOLUTE): 0.1 10*3/uL (ref 0.0–0.4)
Eos: 1 %
Ferritin: 75 ng/mL (ref 15–150)
Folate: 10.4 ng/mL (ref >3.0)
Hematocrit: 38.5 % (ref 34.0–46.6)
Hemoglobin: 12.7 g/dL (ref 11.1–15.9)
Immature Grans (Abs): 0 10*3/uL (ref 0.0–0.1)
Immature Granulocytes: 0 %
Iron Saturation: 27 % (ref 15–55)
Iron: 72 ug/dL (ref 27–159)
Lymphocytes Absolute: 2.1 10*3/uL (ref 0.7–3.1)
Lymphs: 38 %
MCH: 29.5 pg (ref 26.6–33.0)
MCHC: 33 g/dL (ref 31.5–35.7)
MCV: 90 fL (ref 79–97)
Monocytes Absolute: 0.3 10*3/uL (ref 0.1–0.9)
Monocytes: 5 %
Neutrophils Absolute: 3 10*3/uL (ref 1.4–7.0)
Neutrophils: 55 %
Platelets: 352 10*3/uL (ref 150–450)
RBC: 4.3 x10E6/uL (ref 3.77–5.28)
RDW: 12.7 % (ref 11.7–15.4)
Retic Ct Pct: 1.7 % (ref 0.6–2.6)
Total Iron Binding Capacity: 270 ug/dL (ref 250–450)
UIBC: 198 ug/dL (ref 131–425)
Vitamin B-12: 409 pg/mL (ref 232–1245)
WBC: 5.5 10*3/uL (ref 3.4–10.8)

## 2024-03-26 LAB — LIPID PANEL
Chol/HDL Ratio: 4.5 ratio — ABNORMAL HIGH (ref 0.0–4.4)
Cholesterol, Total: 190 mg/dL (ref 100–199)
HDL: 42 mg/dL (ref >39)
LDL Chol Calc (NIH): 117 mg/dL — ABNORMAL HIGH (ref 0–99)
Triglycerides: 176 mg/dL — ABNORMAL HIGH (ref 0–149)
VLDL Cholesterol Cal: 31 mg/dL (ref 5–40)

## 2024-03-26 LAB — VITAMIN D 25 HYDROXY (VIT D DEFICIENCY, FRACTURES): Vit D, 25-Hydroxy: 31.1 ng/mL (ref 30.0–100.0)

## 2024-03-26 LAB — THYROID PANEL WITH TSH
Free Thyroxine Index: 1.7 (ref 1.2–4.9)
T3 Uptake Ratio: 22 % — ABNORMAL LOW (ref 24–39)
T4, Total: 7.6 ug/dL (ref 4.5–12.0)
TSH: 1.55 u[IU]/mL (ref 0.450–4.500)

## 2024-03-27 ENCOUNTER — Other Ambulatory Visit: Payer: Self-pay | Admitting: Family Medicine

## 2024-03-27 DIAGNOSIS — Z1239 Encounter for other screening for malignant neoplasm of breast: Secondary | ICD-10-CM

## 2024-03-27 DIAGNOSIS — Z803 Family history of malignant neoplasm of breast: Secondary | ICD-10-CM

## 2024-03-27 DIAGNOSIS — R239 Unspecified skin changes: Secondary | ICD-10-CM

## 2024-04-21 ENCOUNTER — Other Ambulatory Visit: Payer: Self-pay | Admitting: Family Medicine

## 2024-04-21 DIAGNOSIS — F339 Major depressive disorder, recurrent, unspecified: Secondary | ICD-10-CM

## 2024-04-21 DIAGNOSIS — N644 Mastodynia: Secondary | ICD-10-CM

## 2024-04-21 DIAGNOSIS — R635 Abnormal weight gain: Secondary | ICD-10-CM

## 2024-04-21 DIAGNOSIS — R239 Unspecified skin changes: Secondary | ICD-10-CM

## 2024-04-21 DIAGNOSIS — F411 Generalized anxiety disorder: Secondary | ICD-10-CM

## 2024-04-21 DIAGNOSIS — Z803 Family history of malignant neoplasm of breast: Secondary | ICD-10-CM

## 2024-04-21 DIAGNOSIS — Z1239 Encounter for other screening for malignant neoplasm of breast: Secondary | ICD-10-CM

## 2024-04-21 DIAGNOSIS — N946 Dysmenorrhea, unspecified: Secondary | ICD-10-CM

## 2024-04-21 DIAGNOSIS — Z6841 Body Mass Index (BMI) 40.0 and over, adult: Secondary | ICD-10-CM

## 2024-04-21 DIAGNOSIS — M7989 Other specified soft tissue disorders: Secondary | ICD-10-CM

## 2024-04-22 ENCOUNTER — Other Ambulatory Visit: Payer: Self-pay | Admitting: Family Medicine

## 2024-04-22 DIAGNOSIS — Z803 Family history of malignant neoplasm of breast: Secondary | ICD-10-CM

## 2024-04-22 DIAGNOSIS — Z1239 Encounter for other screening for malignant neoplasm of breast: Secondary | ICD-10-CM

## 2024-04-22 DIAGNOSIS — N644 Mastodynia: Secondary | ICD-10-CM

## 2024-04-22 DIAGNOSIS — R239 Unspecified skin changes: Secondary | ICD-10-CM

## 2024-04-27 ENCOUNTER — Ambulatory Visit
Admission: RE | Admit: 2024-04-27 | Discharge: 2024-04-27 | Disposition: A | Discharge status: Home / Self Care | Payer: Self-pay | Source: Ambulatory Visit | Attending: Family Medicine | Admitting: Family Medicine

## 2024-04-27 ENCOUNTER — Encounter: Payer: Self-pay | Admitting: Family Medicine

## 2024-04-27 ENCOUNTER — Ambulatory Visit: Payer: Self-pay

## 2024-04-27 DIAGNOSIS — R928 Other abnormal and inconclusive findings on diagnostic imaging of breast: Secondary | ICD-10-CM | POA: Diagnosis not present

## 2024-04-27 DIAGNOSIS — Z803 Family history of malignant neoplasm of breast: Secondary | ICD-10-CM

## 2024-04-27 DIAGNOSIS — Z1239 Encounter for other screening for malignant neoplasm of breast: Secondary | ICD-10-CM

## 2024-04-27 DIAGNOSIS — R239 Unspecified skin changes: Secondary | ICD-10-CM

## 2024-04-27 DIAGNOSIS — N644 Mastodynia: Secondary | ICD-10-CM

## 2024-05-06 ENCOUNTER — Encounter: Payer: Self-pay | Admitting: Family Medicine

## 2024-05-06 ENCOUNTER — Telehealth: Payer: Self-pay

## 2024-05-06 ENCOUNTER — Other Ambulatory Visit (HOSPITAL_COMMUNITY): Payer: Self-pay

## 2024-05-06 ENCOUNTER — Ambulatory Visit: Admitting: Family Medicine

## 2024-05-06 VITALS — BP 122/81 | HR 69 | Temp 97.3°F | Ht 63.0 in | Wt 229.2 lb

## 2024-05-06 DIAGNOSIS — N946 Dysmenorrhea, unspecified: Secondary | ICD-10-CM | POA: Diagnosis not present

## 2024-05-06 DIAGNOSIS — Z6841 Body Mass Index (BMI) 40.0 and over, adult: Secondary | ICD-10-CM | POA: Diagnosis not present

## 2024-05-06 DIAGNOSIS — F411 Generalized anxiety disorder: Secondary | ICD-10-CM

## 2024-05-06 DIAGNOSIS — F339 Major depressive disorder, recurrent, unspecified: Secondary | ICD-10-CM | POA: Diagnosis not present

## 2024-05-06 MED ORDER — WEGOVY 0.25 MG/0.5ML ~~LOC~~ SOAJ: 0.2500 mg | SUBCUTANEOUS | 0 refills | Status: DC

## 2024-05-06 NOTE — Progress Notes (Signed)
 Subjective:  Patient ID: Maria Lin, female    DOB: 1986/01/06, 38 y.o.   MRN: [id number]  Patient Care Team: Galvin Jules, FNP as PCP - General (Family Medicine) Rogene Claude, MD as Consulting Physician (Obstetrics and Gynecology)   Chief Complaint:  Hypertension, Anxiety, and Depression (6 week follow up )   HPI: Maria Lin is a 38 y.o. female presenting on 05/06/2024 for Hypertension, Anxiety, and Depression (6 week follow up )   Maria Lin is a 38 year old female with hypertension and depression who presents for follow-up on her blood pressure and medication management.  She has been monitoring her blood pressure at home, which remains within the target range. She has implemented dietary changes, including increased consumption of whole grains and organic foods, and maintains a routine of walking up to five miles daily. These lifestyle modifications have resulted in a weight reduction from 234 pounds to 229 pounds.  She is currently taking sertraline  every evening before bed, having found it more effective than morning dosing. However, she experiences extreme fatigue throughout the day, which began approximately one week after initiating the medication. She manages this fatigue by taking a two-hour nap while her children are at school.  She reports irregular and painful menstrual cycles, with daily pain associated with this issue. She has not yet consulted the referred specialist due to not receiving a call.  In her family history, there is a known history of breast cancer. Her mother had gallstones and a ruptured gallbladder. There is no known history of medullary  thyroid  cancer or MEN2 syndrome, but she mentions overactive thyroid  problems in the family.  She has never used weight loss medications and previously attempted weight loss through gym workouts, which she can no longer afford. She is currently managing her weight independently at home. She  recently lost her job, allowing her to spend more time with her children.        05/06/2024   11:45 AM 03/25/2024    2:03 PM 01/20/2019    2:57 PM 10/28/2018    8:02 AM 10/14/2018    9:12 AM  Depression screen PHQ 2/9  Decreased Interest 0 0 0 2 0  Down, Depressed, Hopeless 0 1 0 2 0  PHQ - 2 Score 0 1 0 4 0  Altered sleeping 0 2  0   Tired, decreased energy 3 2  2    Change in appetite 0 0  2   Feeling bad or failure about yourself  0 0  1   Trouble concentrating 0 0  0   Moving slowly or fidgety/restless 0 0  0   Suicidal thoughts 0 0  0   PHQ-9 Score 3 5  9    Difficult doing work/chores Not difficult at all Not difficult at all         05/06/2024   11:46 AM 03/25/2024    2:03 PM 10/28/2018    8:13 AM 10/14/2018    9:29 AM  GAD 7 : Generalized Anxiety Score  Nervous, Anxious, on Edge 0 2 1 2   Control/stop worrying 0 1 0 2  Worry too much - different things 0 1 1 2   Trouble relaxing 0 0 0 3  Restless 0 0 0 1  Easily annoyed or irritable 0 2 0 3  Afraid - awful might happen 0 2 1 3   Total GAD 7 Score 0 8 3 16   Anxiety Difficulty Not difficult at all Somewhat  difficult Not difficult at all Very difficult       Relevant past medical, surgical, family, and social history reviewed and updated as indicated.  Allergies and medications reviewed and updated. Data reviewed: Chart in Epic.   Past Medical History:  Diagnosis Date   Anxiety    Depression    hx depression and pp depression   Migraines    PONV (postoperative nausea and vomiting)    with labor     Past Surgical History:  Procedure Laterality Date   CESAREAN SECTION N/A 01/26/2013   Procedure: CESAREAN SECTION of baby girl   at 0865  APGAR 9/9;  Surgeon: Cyd Dowse, MD;  Location: [facility];  Service: Obstetrics;  Laterality: N/A;   LAPAROSCOPIC TUBAL LIGATION Bilateral 02/09/2015   Procedure: LAPAROSCOPIC TUBAL LIGATION;  Surgeon: Adrianna Albee, MD;  Location: [facility];  Service: Gynecology;  Laterality:  Bilateral;   NO PAST SURGERIES     TUBAL LIGATION      Social History   Socioeconomic History   Marital status: Married    Spouse name: Not on file   Number of children: Not on file   Years of education: Not on file   Highest education level: 12th grade  Occupational History   Not on file  Tobacco Use   Smoking status: Some Days    Current packs/day: 0.10    Average packs/day: 0.1 packs/day for 19.0 years (1.9 ttl pk-yrs)    Types: Cigarettes   Smokeless tobacco: Never  Vaping Use   Vaping status: Never Used  Substance and Sexual Activity   Alcohol use: Yes    Comment: social   Drug use: No   Sexual activity: Yes  Other Topics Concern   Not on file  Social History Narrative   Not on file   Social Drivers of Health   Financial Resource Strain: Medium Risk (03/25/2024)   Overall Financial Resource Strain (CARDIA)    Difficulty of Paying Living Expenses: Somewhat hard  Food Insecurity: Food Insecurity Present (03/25/2024)   Hunger Vital Sign    Worried About Running Out of Food in the Last Year: Sometimes true    Ran Out of Food in the Last Year: Sometimes true  Transportation Needs: No Transportation Needs (03/25/2024)   PRAPARE - Administrator, Civil Service (Medical): No    Lack of Transportation (Non-Medical): No  Physical Activity: Insufficiently Active (03/25/2024)   Exercise Vital Sign    Days of Exercise per Week: 5 days    Minutes of Exercise per Session: 10 min  Stress: Stress Concern Present (03/25/2024)   [employer] of Occupational Health - Occupational Stress Questionnaire    Feeling of Stress : Very much  Social Connections: Socially Isolated (03/25/2024)   Social Connection and Isolation Panel [NHANES]    Frequency of Communication with Friends and Family: Once a week    Frequency of Social Gatherings with Friends and Family: Never    Attends Religious Services: Never    Database administrator or Organizations: No    Attends Museum/gallery exhibitions officer: Not on file    Marital Status: Living with partner  Intimate Partner Violence: Not on file    Outpatient Encounter Medications as of 05/06/2024  Medication Sig   Semaglutide-Weight Management (WEGOVY) 0.25 MG/0.5ML SOAJ Inject 0.25 mg into the skin once a week for 4 doses.   sertraline  (ZOLOFT ) 50 MG tablet Take 1 tablet (50 mg total) by mouth daily. Take 1/2 tablet  for 6 days and then increase to 1 tablet daily.   No facility-administered encounter medications on file as of 05/06/2024.    Allergies  Allergen Reactions   Codeine Nausea And Vomiting and Nausea Only   Hydrocodone-Acetaminophen  Nausea Only   Penicillins Hives   Vicodin [Hydrocodone-Acetaminophen ] Nausea And Vomiting    Pertinent ROS per HPI, otherwise unremarkable      Objective:  BP 122/81   Pulse 69   Temp (!) 97.3 F (36.3 C)   Ht 5\' 3"  (1.6 m)   Wt 229 lb 3.2 oz (104 kg)   LMP 04/14/2024   SpO2 98%   BMI 40.60 kg/m    Wt Readings from Last 3 Encounters:  05/06/24 229 lb 3.2 oz (104 kg)  03/25/24 234 lb (106.1 kg)  01/20/19 195 lb (88.5 kg)    Physical Exam Vitals and nursing note reviewed.  Constitutional:      Appearance: Normal appearance. She is well-developed and well-groomed. She is morbidly obese.  HENT:     Head: Normocephalic and atraumatic.     Nose: Nose normal.     Mouth/Throat:     Mouth: Mucous membranes are moist.  Eyes:     Pupils: Pupils are equal, round, and reactive to light.  Neck:     Thyroid : No thyroid  mass, thyromegaly or thyroid  tenderness.  Cardiovascular:     Rate and Rhythm: Normal rate and regular rhythm.     Heart sounds: Normal heart sounds.  Pulmonary:     Effort: Pulmonary effort is normal.     Breath sounds: Normal breath sounds.  Musculoskeletal:     Cervical back: Neck supple.     Right lower leg: No edema.     Left lower leg: No edema.  Skin:    General: Skin is warm and dry.     Capillary Refill: Capillary refill takes less  than 2 seconds.  Neurological:     General: No focal deficit present.     Mental Status: She is alert and oriented to person, place, and time.  Psychiatric:        Mood and Affect: Mood normal.        Behavior: Behavior normal. Behavior is cooperative.        Thought Content: Thought content normal.        Judgment: Judgment normal.      Results for orders placed or performed in visit on 03/25/24  Bayer DCA Hb A1c Waived   Collection Time: 03/25/24  2:26 PM  Result Value Ref Range   HB A1C (BAYER DCA - WAIVED) 4.9 4.8 - 5.6 %  Anemia Profile B   Collection Time: 03/25/24  2:31 PM  Result Value Ref Range   Total Iron Binding Capacity 270 250 - 450 ug/dL   UIBC 784 696 - 295 ug/dL   Iron 72 27 - 284 ug/dL   Iron Saturation 27 15 - 55 %   Ferritin 75 15 - 150 ng/mL   Vitamin B-12 409 232 - 1,245 pg/mL   Folate 10.4 >3.0 ng/mL   WBC 5.5 3.4 - 10.8 x10E3/uL   RBC 4.30 3.77 - 5.28 x10E6/uL   Hemoglobin 12.7 11.1 - 15.9 g/dL   Hematocrit 13.2 44.0 - 46.6 %   MCV 90 79 - 97 fL   MCH 29.5 26.6 - 33.0 pg   MCHC 33.0 31.5 - 35.7 g/dL   RDW 10.2 72.5 - 36.6 %   Platelets 352 150 - 450 x10E3/uL   Neutrophils  55 Not Estab. %   Lymphs 38 Not Estab. %   Monocytes 5 Not Estab. %   Eos 1 Not Estab. %   Basos 1 Not Estab. %   Neutrophils Absolute 3.0 1.4 - 7.0 x10E3/uL   Lymphocytes Absolute 2.1 0.7 - 3.1 x10E3/uL   Monocytes Absolute 0.3 0.1 - 0.9 x10E3/uL   EOS (ABSOLUTE) 0.1 0.0 - 0.4 x10E3/uL   Basophils Absolute 0.0 0.0 - 0.2 x10E3/uL   Immature Granulocytes 0 Not Estab. %   Immature Grans (Abs) 0.0 0.0 - 0.1 x10E3/uL   Retic Ct Pct 1.7 0.6 - 2.6 %  CMP14+EGFR   Collection Time: 03/25/24  2:31 PM  Result Value Ref Range   Glucose 114 (H) 70 - 99 mg/dL   BUN 10 6 - 20 mg/dL   Creatinine, Ser 1.61 0.57 - 1.00 mg/dL   eGFR 096 >04 VW/UJW/1.19   BUN/Creatinine Ratio 18 9 - 23   Sodium 140 134 - 144 mmol/L   Potassium 4.1 3.5 - 5.2 mmol/L   Chloride 105 96 - 106 mmol/L   CO2  23 20 - 29 mmol/L   Calcium 9.3 8.7 - 10.2 mg/dL   Total Protein 6.6 6.0 - 8.5 g/dL   Albumin 4.1 3.9 - 4.9 g/dL   Globulin, Total 2.5 1.5 - 4.5 g/dL   Bilirubin Total 0.4 0.0 - 1.2 mg/dL   Alkaline Phosphatase 73 44 - 121 IU/L   AST 19 0 - 40 IU/L   ALT 13 0 - 32 IU/L  Lipid panel   Collection Time: 03/25/24  2:31 PM  Result Value Ref Range   Cholesterol, Total 190 100 - 199 mg/dL   Triglycerides 147 (H) 0 - 149 mg/dL   HDL 42 >82 mg/dL   VLDL Cholesterol Cal 31 5 - 40 mg/dL   LDL Chol Calc (NIH) 956 (H) 0 - 99 mg/dL   Chol/HDL Ratio 4.5 (H) 0.0 - 4.4 ratio  Thyroid  Panel With TSH   Collection Time: 03/25/24  2:31 PM  Result Value Ref Range   TSH 1.550 0.450 - 4.500 uIU/mL   T4, Total 7.6 4.5 - 12.0 ug/dL   T3 Uptake Ratio 22 (L) 24 - 39 %   Free Thyroxine Index 1.7 1.2 - 4.9  VITAMIN D  25 Hydroxy (Vit-D Deficiency, Fractures)   Collection Time: 03/25/24  2:31 PM  Result Value Ref Range   Vit D, 25-Hydroxy 31.1 30.0 - 100.0 ng/mL       Pertinent labs & imaging results that were available during my care of the patient were reviewed by me and considered in my medical decision making.  Assessment & Plan:  Leyan was seen today for hypertension, anxiety and depression.  Diagnoses and all orders for this visit:  Depression, recurrent (HCC) Generalized anxiety disorder Doing better but has increased fatigue. Will continue medications to see if fatigue subsides.   BMI 40.0-44.9, adult (HCC) -     Semaglutide-Weight Management (WEGOVY) 0.25 MG/0.5ML SOAJ; Inject 0.25 mg into the skin once a week for 4 doses.  Dysmenorrhea -     Ambulatory referral to Gynecology     Irregular and painful menstrual cycles Persistent irregular and painful menstrual cycles causing daily discomfort. Previous referral to [facility] was unsuccessful due to insurance issues, and she has not yet been evaluated by a specialist. - Submit a new referral to a specialist who accepts her insurance. - Ensure  the referrals coordinator is aware of the insurance requirements.  Weight management Actively engaged  in weight loss through diet and exercise, achieving a 5-pound reduction since the last visit. Considering ZOXWRU, a once-weekly injection, for weight management. Insurance coverage needs confirmation. Potential side effects include constipation, diarrhea, and sour burps. Monitoring of thyroid  and liver function is necessary. - Submit prescription for [facility] and confirm insurance coverage. - Educate on administration of Wegovy and potential side effects. - Schedule follow-up in four weeks to assess response to [facility] and monitor thyroid  and liver function.  Constipation Constipation is a potential side effect of Wegovy. Currently managed with daily laxatives. Wegovy may exacerbate constipation; advised to increase water intake to mitigate this. - Continue daily laxative use. - Educate on potential constipation as a side effect of Wegovy and strategies to mitigate it, such as increasing water intake.  Fatigue due to Sertraline  Fatigue emerged as a new symptom after initiating sertraline , persisting despite dosage adjustment. Fatigue is significant, necessitating daily naps. Current higher dose of sertraline  may contribute to fatigue. - Continue current sertraline  regimen. - Monitor fatigue symptoms and report if she persists after adjustment period.       Continue all other maintenance medications.  Follow up plan: Return in about 4 weeks (around 06/03/2024), or if symptoms worsen or fail to improve, for BMI.   Continue healthy lifestyle choices, including diet (rich in fruits, vegetables, and lean proteins, and low in salt and simple carbohydrates) and exercise (at least 30 minutes of moderate physical activity daily).  Educational handout given for GLP-1 success  The above assessment and management plan was discussed with the patient. The patient verbalized understanding of and has  agreed to the management plan. Patient is aware to call the clinic if they develop any new symptoms or if symptoms persist or worsen. Patient is aware when to return to the clinic for a follow-up visit. Patient educated on when it is appropriate to go to the emergency department.   Kattie Parrot, FNP-C [facility] [phone]

## 2024-05-06 NOTE — Telephone Encounter (Signed)
 Pharmacy Patient Advocate Encounter  Received notification from [facility] that Prior Authorization for Wegovy 0.25 has been APPROVED from 05/06/24 to 11/06/24. Ran test claim, Copay is $4.00. This test claim was processed through [facility]- copay amounts may vary at other pharmacies due to pharmacy/plan contracts, or as the patient moves through the different stages of their insurance plan.   PA #/Case ID/Reference #: [id number]

## 2024-05-06 NOTE — Telephone Encounter (Signed)
 Pharmacy Patient Advocate Encounter   Received notification from Patient Pharmacy that prior authorization for Wegovy 0.25 is required/requested.   Insurance verification completed.   The patient is insured through [facility] .   Per test claim: PA required; PA submitted to above mentioned insurance via CoverMyMeds Key/confirmation #/EOC [id number] Status is pending

## 2024-05-06 NOTE — Patient Instructions (Signed)

## 2024-05-06 NOTE — Telephone Encounter (Signed)
 Patient aware and verbalizes understanding.

## 2024-05-07 ENCOUNTER — Other Ambulatory Visit (HOSPITAL_COMMUNITY): Payer: Self-pay

## 2024-05-25 ENCOUNTER — Other Ambulatory Visit: Payer: Self-pay | Admitting: Family Medicine

## 2024-05-25 DIAGNOSIS — Z6841 Body Mass Index (BMI) 40.0 and over, adult: Secondary | ICD-10-CM

## 2024-05-26 ENCOUNTER — Telehealth: Payer: Self-pay

## 2024-05-26 DIAGNOSIS — Z6841 Body Mass Index (BMI) 40.0 and over, adult: Secondary | ICD-10-CM

## 2024-05-26 MED ORDER — WEGOVY 0.25 MG/0.5ML ~~LOC~~ SOAJ: 0.2500 mg | SUBCUTANEOUS | 0 refills | Status: AC

## 2024-05-26 NOTE — Telephone Encounter (Signed)
 Current dose refilled until her appt

## 2024-05-26 NOTE — Telephone Encounter (Signed)
 Please advise, I sent this yesterday with only the question of should the patient not be going up in dose without the information that Next OV was 06/16/24

## 2024-05-26 NOTE — Addendum Note (Signed)
 Addended by: Briley Sulton D on: 05/26/2024 03:04 PM   Modules accepted: Orders

## 2024-05-26 NOTE — Telephone Encounter (Signed)
 Copied from CRM [id number]. Topic: Clinical - Medication Question >> May 26, 2024  1:06 PM Maria Lin wrote: Reason for CRM: patient called to see why her provider denied her med refill for WEGOVY  0.25 MG/0.5ML when she has an appt scheduled for 7/1. Please advise

## 2024-06-16 ENCOUNTER — Ambulatory Visit: Admitting: Family Medicine

## 2024-06-16 ENCOUNTER — Encounter: Payer: Self-pay | Admitting: Family Medicine

## 2024-06-16 VITALS — BP 122/83 | HR 66 | Temp 97.6°F | Ht 63.0 in | Wt 222.6 lb

## 2024-06-16 DIAGNOSIS — Z6841 Body Mass Index (BMI) 40.0 and over, adult: Secondary | ICD-10-CM

## 2024-06-16 DIAGNOSIS — Z1329 Encounter for screening for other suspected endocrine disorder: Secondary | ICD-10-CM | POA: Diagnosis not present

## 2024-06-16 MED ORDER — WEGOVY 0.5 MG/0.5ML ~~LOC~~ SOAJ: 0.5000 mg | SUBCUTANEOUS | 0 refills | Status: DC

## 2024-06-16 MED ORDER — WEGOVY 2.4 MG/0.75ML ~~LOC~~ SOAJ: 2.4000 mg | SUBCUTANEOUS | 0 refills | Status: DC

## 2024-06-16 MED ORDER — WEGOVY 1.7 MG/0.75ML ~~LOC~~ SOAJ: 1.7000 mg | SUBCUTANEOUS | 0 refills | Status: DC

## 2024-06-16 MED ORDER — WEGOVY 1 MG/0.5ML ~~LOC~~ SOAJ: 1.0000 mg | SUBCUTANEOUS | 0 refills | Status: AC

## 2024-06-16 NOTE — Patient Instructions (Signed)

## 2024-06-16 NOTE — Progress Notes (Signed)
 Subjective:  Patient ID: Maria Lin, female    DOB: [birth year], 38 y.o.   MRN: [id number]  Patient Care Team: Severa Rock HERO, FNP as PCP - General (Family Medicine) Estelle Service, MD as Consulting Physician (Obstetrics and Gynecology)   Chief Complaint:  BMI (4 week re check )   HPI: Maria Lin is a 38 y.o. female presenting on 06/16/2024 for BMI (4 week re check )   Maria Lin is a 38 year old female who presents for follow-up regarding her weight management and medication use.  Weight management - Weight decreased from 229 pounds at last visit to 222 pounds today - Lifestyle modifications include quitting soda, drinking only water, and exercising in her pool - Decreased walking activity due to hot weather  Medication use and injection site reactions - Currently taking medication at a dose of 0.25 mg - Experiences burning pain the day after injection - Rotates injection sites to avoid repeated use of the same area - Initial week on medication was challenging, but no significant side effects since then  Gastrointestinal and peripheral symptoms - No nausea, vomiting, constipation, or diarrhea - No swelling in legs or feet          Relevant past medical, surgical, family, and social history reviewed and updated as indicated.  Allergies and medications reviewed and updated. Data reviewed: Chart in Epic.   Past Medical History:  Diagnosis Date   Anxiety    Depression    hx depression and pp depression   Migraines    PONV (postoperative nausea and vomiting)    with labor     Past Surgical History:  Procedure Laterality Date   CESAREAN SECTION N/A 01/26/2013   Procedure: CESAREAN SECTION of baby girl   at 9386  APGAR 9/9;  Surgeon: Krystal Deaner, MD;  Location: [facility];  Service: Obstetrics;  Laterality: N/A;   LAPAROSCOPIC TUBAL LIGATION Bilateral 02/09/2015   Procedure: LAPAROSCOPIC TUBAL LIGATION;  Surgeon: Service LELON Estelle, MD;   Location: [facility];  Service: Gynecology;  Laterality: Bilateral;   NO PAST SURGERIES     TUBAL LIGATION      Social History   Socioeconomic History   Marital status: Married    Spouse name: Not on file   Number of children: Not on file   Years of education: Not on file   Highest education level: 12th grade  Occupational History   Not on file  Tobacco Use   Smoking status: Some Days    Current packs/day: 0.10    Average packs/day: 0.1 packs/day for 19.0 years (1.9 ttl pk-yrs)    Types: Cigarettes   Smokeless tobacco: Never  Vaping Use   Vaping status: Never Used  Substance and Sexual Activity   Alcohol use: Yes    Comment: social   Drug use: No   Sexual activity: Yes  Other Topics Concern   Not on file  Social History Narrative   Not on file   Social Drivers of Health   Financial Resource Strain: Medium Risk (06/15/2024)   Overall Financial Resource Strain (CARDIA)    Difficulty of Paying Living Expenses: Somewhat hard  Food Insecurity: Food Insecurity Present (06/15/2024)   Hunger Vital Sign    Worried About Running Out of Food in the Last Year: Sometimes true    Ran Out of Food in the Last Year: Never true  Transportation Needs: No Transportation Needs (06/15/2024)   PRAPARE - Transportation    Lack  of Transportation (Medical): No    Lack of Transportation (Non-Medical): No  Physical Activity: Sufficiently Active (06/15/2024)   Exercise Vital Sign    Days of Exercise per Week: 5 days    Minutes of Exercise per Session: 60 min  Recent Concern: Physical Activity - Insufficiently Active (03/25/2024)   Exercise Vital Sign    Days of Exercise per Week: 5 days    Minutes of Exercise per Session: 10 min  Stress: Patient Declined (06/15/2024)   [employer] of Occupational Health - Occupational Stress Questionnaire    Feeling of Stress: Patient declined  Recent Concern: Stress - Stress Concern Present (03/25/2024)   [employer] of Occupational Health - Occupational  Stress Questionnaire    Feeling of Stress : Very much  Social Connections: Socially Isolated (06/15/2024)   Social Connection and Isolation Panel    Frequency of Communication with Friends and Family: Once a week    Frequency of Social Gatherings with Friends and Family: Once a week    Attends Religious Services: Never    Database administrator or Organizations: No    Attends Engineer, structural: Not on file    Marital Status: Living with partner  Intimate Partner Violence: Not on file    Outpatient Encounter Medications as of 06/16/2024  Medication Sig   Semaglutide -Weight Management (WEGOVY ) 0.25 MG/0.5ML SOAJ Inject 0.25 mg into the skin once a week for 4 doses.   Semaglutide -Weight Management (WEGOVY ) 0.5 MG/0.5ML SOAJ Inject 0.5 mg into the skin once a week for 4 doses.   [START ON 07/16/2024] Semaglutide -Weight Management (WEGOVY ) 1 MG/0.5ML SOAJ Inject 1 mg into the skin once a week for 4 doses.   [START ON 08/15/2024] Semaglutide -Weight Management (WEGOVY ) 1.7 MG/0.75ML SOAJ Inject 1.7 mg into the skin once a week.   [START ON 09/14/2024] Semaglutide -Weight Management (WEGOVY ) 2.4 MG/0.75ML SOAJ Inject 2.4 mg into the skin once a week.   sertraline  (ZOLOFT ) 50 MG tablet Take 1 tablet (50 mg total) by mouth daily. Take 1/2 tablet for 6 days and then increase to 1 tablet daily.   No facility-administered encounter medications on file as of 06/16/2024.    Allergies  Allergen Reactions   Codeine Nausea And Vomiting and Nausea Only   Hydrocodone-Acetaminophen  Nausea Only   Penicillins Hives   Vicodin [Hydrocodone-Acetaminophen ] Nausea And Vomiting    Pertinent ROS per HPI, otherwise unremarkable      Objective:  BP 122/83   Pulse 66   Temp 97.6 F (36.4 C)   Ht 5' 3 (1.6 m)   Wt 222 lb 9.6 oz (101 kg)   SpO2 97%   BMI 39.43 kg/m    Wt Readings from Last 3 Encounters:  06/16/24 222 lb 9.6 oz (101 kg)  05/06/24 229 lb 3.2 oz (104 kg)  03/25/24 234 lb (106.1 kg)     Physical Exam Vitals and nursing note reviewed.  Constitutional:      General: She is not in acute distress.    Appearance: Normal appearance. She is obese. She is not ill-appearing, toxic-appearing or diaphoretic.  HENT:     Head: Normocephalic and atraumatic.     Nose: Nose normal.     Mouth/Throat:     Mouth: Mucous membranes are moist.   Eyes:     Conjunctiva/sclera: Conjunctivae normal.     Pupils: Pupils are equal, round, and reactive to light.   Neck:     Thyroid : No thyroid  mass, thyromegaly or thyroid  tenderness.   Cardiovascular:  Rate and Rhythm: Normal rate and regular rhythm.     Heart sounds: Normal heart sounds.  Pulmonary:     Effort: Pulmonary effort is normal.     Breath sounds: Normal breath sounds.   Musculoskeletal:     Cervical back: Normal range of motion and neck supple.     Right lower leg: No edema.     Left lower leg: No edema.   Skin:    General: Skin is warm and dry.     Capillary Refill: Capillary refill takes less than 2 seconds.   Neurological:     General: No focal deficit present.     Mental Status: She is alert and oriented to person, place, and time.   Psychiatric:        Mood and Affect: Mood normal.        Behavior: Behavior normal.        Thought Content: Thought content normal.        Judgment: Judgment normal.      Results for orders placed or performed in visit on 03/25/24  Bayer DCA Hb A1c Waived   Collection Time: 03/25/24  2:26 PM  Result Value Ref Range   HB A1C (BAYER DCA - WAIVED) 4.9 4.8 - 5.6 %  Anemia Profile B   Collection Time: 03/25/24  2:31 PM  Result Value Ref Range   Total Iron Binding Capacity 270 250 - 450 ug/dL   UIBC 801 868 - 574 ug/dL   Iron 72 27 - 840 ug/dL   Iron Saturation 27 15 - 55 %   Ferritin 75 15 - 150 ng/mL   Vitamin B-12 409 232 - 1,245 pg/mL   Folate 10.4 >3.0 ng/mL   WBC 5.5 3.4 - 10.8 x10E3/uL   RBC 4.30 3.77 - 5.28 x10E6/uL   Hemoglobin 12.7 11.1 - 15.9 g/dL    Hematocrit 61.4 65.9 - 46.6 %   MCV 90 79 - 97 fL   MCH 29.5 26.6 - 33.0 pg   MCHC 33.0 31.5 - 35.7 g/dL   RDW 87.2 88.2 - 84.5 %   Platelets 352 150 - 450 x10E3/uL   Neutrophils 55 Not Estab. %   Lymphs 38 Not Estab. %   Monocytes 5 Not Estab. %   Eos 1 Not Estab. %   Basos 1 Not Estab. %   Neutrophils Absolute 3.0 1.4 - 7.0 x10E3/uL   Lymphocytes Absolute 2.1 0.7 - 3.1 x10E3/uL   Monocytes Absolute 0.3 0.1 - 0.9 x10E3/uL   EOS (ABSOLUTE) 0.1 0.0 - 0.4 x10E3/uL   Basophils Absolute 0.0 0.0 - 0.2 x10E3/uL   Immature Granulocytes 0 Not Estab. %   Immature Grans (Abs) 0.0 0.0 - 0.1 x10E3/uL   Retic Ct Pct 1.7 0.6 - 2.6 %  CMP14+EGFR   Collection Time: 03/25/24  2:31 PM  Result Value Ref Range   Glucose 114 (H) 70 - 99 mg/dL   BUN 10 6 - 20 mg/dL   Creatinine, Ser 9.42 0.57 - 1.00 mg/dL   eGFR 878 >40 fO/fpw/8.26   BUN/Creatinine Ratio 18 9 - 23   Sodium 140 134 - 144 mmol/L   Potassium 4.1 3.5 - 5.2 mmol/L   Chloride 105 96 - 106 mmol/L   CO2 23 20 - 29 mmol/L   Calcium 9.3 8.7 - 10.2 mg/dL   Total Protein 6.6 6.0 - 8.5 g/dL   Albumin 4.1 3.9 - 4.9 g/dL   Globulin, Total 2.5 1.5 - 4.5 g/dL   Bilirubin Total  0.4 0.0 - 1.2 mg/dL   Alkaline Phosphatase 73 44 - 121 IU/L   AST 19 0 - 40 IU/L   ALT 13 0 - 32 IU/L  Lipid panel   Collection Time: 03/25/24  2:31 PM  Result Value Ref Range   Cholesterol, Total 190 100 - 199 mg/dL   Triglycerides 823 (H) 0 - 149 mg/dL   HDL 42 >60 mg/dL   VLDL Cholesterol Cal 31 5 - 40 mg/dL   LDL Chol Calc (NIH) 882 (H) 0 - 99 mg/dL   Chol/HDL Ratio 4.5 (H) 0.0 - 4.4 ratio  Thyroid  Panel With TSH   Collection Time: 03/25/24  2:31 PM  Result Value Ref Range   TSH 1.550 0.450 - 4.500 uIU/mL   T4, Total 7.6 4.5 - 12.0 ug/dL   T3 Uptake Ratio 22 (L) 24 - 39 %   Free Thyroxine Index 1.7 1.2 - 4.9  VITAMIN D  25 Hydroxy (Vit-D Deficiency, Fractures)   Collection Time: 03/25/24  2:31 PM  Result Value Ref Range   Vit D, 25-Hydroxy 31.1 30.0 -  100.0 ng/mL       Pertinent labs & imaging results that were available during my care of the patient were reviewed by me and considered in my medical decision making.  Assessment & Plan:  Maria Lin was seen today for bmi.  Diagnoses and all orders for this visit:  BMI 40.0-44.9, adult (HCC) -     Thyroid  Panel With TSH -     CMP14+EGFR -     Semaglutide -Weight Management (WEGOVY ) 0.5 MG/0.5ML SOAJ; Inject 0.5 mg into the skin once a week for 4 doses. -     Semaglutide -Weight Management (WEGOVY ) 1 MG/0.5ML SOAJ; Inject 1 mg into the skin once a week for 4 doses. -     Semaglutide -Weight Management (WEGOVY ) 1.7 MG/0.75ML SOAJ; Inject 1.7 mg into the skin once a week. -     Semaglutide -Weight Management (WEGOVY ) 2.4 MG/0.75ML SOAJ; Inject 2.4 mg into the skin once a week.        Obesity She is experiencing obesity, currently weighing 222 pounds, down from 229 pounds at the last visit. She is on a weight loss medication regimen and has made lifestyle changes, including quitting soda and exercising in her pool. The weight loss is progressing at a rate of more than one pound per week, which is considered healthy. She reports a burning pain at the injection site but no other significant side effects such as nausea, vomiting, constipation, or diarrhea. Informed consent was provided regarding the medication regimen, including potential injection site pain and the absence of other common side effects. The anticipated outcome is continued weight loss at a healthy pace. Labs will be conducted to monitor thyroid , kidney, and liver function due to the medication use. - Order labs to check thyroid  function, kidney function, and liver function - Continue current medication regimen with an increase in dosing: 0.5 mg for four weeks, then 1 mg, 1.7 mg, and finally 2.4 mg as the max dose - Schedule follow-up appointment in three months  General Health Maintenance She is actively engaged in lifestyle  modifications to support weight loss, including dietary changes and exercise.          Continue all other maintenance medications.  Follow up plan: Return in about 3 months (around 09/16/2024), or if symptoms worsen or fail to improve, for BMI.   Continue healthy lifestyle choices, including diet (rich in fruits, vegetables, and lean proteins, and low in  salt and simple carbohydrates) and exercise (at least 30 minutes of moderate physical activity daily).  Educational handout given for GLP1 success   The above assessment and management plan was discussed with the patient. The patient verbalized understanding of and has agreed to the management plan. Patient is aware to call the clinic if they develop any new symptoms or if symptoms persist or worsen. Patient is aware when to return to the clinic for a follow-up visit. Patient educated on when it is appropriate to go to the emergency department.   Maria Bruns, FNP-C [facility] [phone]

## 2024-06-17 ENCOUNTER — Ambulatory Visit: Payer: Self-pay | Admitting: Family Medicine

## 2024-06-17 LAB — CMP14+EGFR
ALT: 11 IU/L (ref 0–32)
AST: 15 IU/L (ref 0–40)
Albumin: 4.1 g/dL (ref 3.9–4.9)
Alkaline Phosphatase: 79 IU/L (ref 44–121)
BUN/Creatinine Ratio: 19 (ref 9–23)
BUN: 13 mg/dL (ref 6–20)
Bilirubin Total: 0.3 mg/dL (ref 0.0–1.2)
CO2: 20 mmol/L (ref 20–29)
Calcium: 9.3 mg/dL (ref 8.7–10.2)
Chloride: 103 mmol/L (ref 96–106)
Creatinine, Ser: 0.69 mg/dL (ref 0.57–1.00)
Globulin, Total: 2.6 g/dL (ref 1.5–4.5)
Glucose: 79 mg/dL (ref 70–99)
Potassium: 4.8 mmol/L (ref 3.5–5.2)
Sodium: 137 mmol/L (ref 134–144)
Total Protein: 6.7 g/dL (ref 6.0–8.5)
eGFR: 116 mL/min/{1.73_m2} (ref >59)

## 2024-06-17 LAB — THYROID PANEL WITH TSH
Free Thyroxine Index: 1.7 (ref 1.2–4.9)
T3 Uptake Ratio: 24 % (ref 24–39)
T4, Total: 7.2 ug/dL (ref 4.5–12.0)
TSH: 1.55 u[IU]/mL (ref 0.450–4.500)

## 2024-06-25 ENCOUNTER — Telehealth: Payer: Self-pay

## 2024-06-25 ENCOUNTER — Other Ambulatory Visit: Payer: Self-pay | Admitting: *Deleted

## 2024-06-25 DIAGNOSIS — Z6841 Body Mass Index (BMI) 40.0 and over, adult: Secondary | ICD-10-CM

## 2024-06-25 NOTE — Telephone Encounter (Signed)
 Pt notified that it was sent in on 06/16/24 LS

## 2024-06-25 NOTE — Telephone Encounter (Signed)
 Copied from CRM [id number]. Topic: Clinical - Prescription Issue >> Jun 25, 2024 11:47 AM Travis F wrote: Reason for CRM: Patient is calling in because the pharmacy no longer has her prescription for Semaglutide -Weight Management (WEGOVY ) 0.5 MG/0.5ML SOAJ [[id number]] and they need it resent. Please resend the prescription to [facility]

## 2024-06-29 ENCOUNTER — Telehealth: Payer: Self-pay | Admitting: Family Medicine

## 2024-06-29 DIAGNOSIS — Z6841 Body Mass Index (BMI) 40.0 and over, adult: Secondary | ICD-10-CM

## 2024-06-29 MED ORDER — WEGOVY 0.5 MG/0.5ML ~~LOC~~ SOAJ: 0.5000 mg | SUBCUTANEOUS | 0 refills | Status: DC

## 2024-06-29 NOTE — Telephone Encounter (Signed)
 Med resent. Please make sure they actually have her other doses on file for future fills

## 2024-06-29 NOTE — Telephone Encounter (Signed)
 Copied from CRM [id number]. Topic: Clinical - Prescription Issue >> Jun 29, 2024 11:48 AM Wess RAMAN wrote: Reason for CRM: Patient states [facility] still have not received her prescription for Semaglutide -Weight Management (WEGOVY ) 0.5 MG/0.5ML EMMANUEL Rushing #: [phone]  Preferred Pharmacy:  [facility] [address] [place] - [address] [place] [address] Phone: [phone] Fax: [phone] Hours: Not open 24 hours

## 2024-06-29 NOTE — Addendum Note (Signed)
 Addended by: JOLINDA NORENE HERO on: 06/29/2024 04:38 PM   Modules accepted: Orders

## 2024-06-29 NOTE — Telephone Encounter (Signed)
 After speaking with pharmacy and patient it seems that prescription for 0.5 wegovy  was cancelled.   Patient is finishing up 0.25.  Pharmacy states a new prescription needs to be sent over for Semaglutide -Weight Management (WEGOVY ) 0.5 MG/0.5ML SOAJ

## 2024-06-30 ENCOUNTER — Other Ambulatory Visit (HOSPITAL_COMMUNITY)
Admission: RE | Admit: 2024-06-30 | Discharge: 2024-06-30 | Disposition: A | Discharge status: Home / Self Care | Source: Ambulatory Visit | Attending: Women's Health | Admitting: Women's Health

## 2024-06-30 ENCOUNTER — Encounter: Payer: Self-pay | Admitting: Women's Health

## 2024-06-30 ENCOUNTER — Ambulatory Visit: Admitting: Women's Health

## 2024-06-30 VITALS — BP 126/84 | HR 61 | Ht 63.0 in | Wt 224.3 lb

## 2024-06-30 DIAGNOSIS — N946 Dysmenorrhea, unspecified: Secondary | ICD-10-CM

## 2024-06-30 DIAGNOSIS — Z124 Encounter for screening for malignant neoplasm of cervix: Secondary | ICD-10-CM | POA: Insufficient documentation

## 2024-06-30 DIAGNOSIS — N920 Excessive and frequent menstruation with regular cycle: Secondary | ICD-10-CM

## 2024-06-30 NOTE — Progress Notes (Signed)
 GYN VISIT Patient name: Maria Lin MRN [id number]  Date of birth: 02-08-1986 Chief Complaint:   Referral (Dysmenorrhea)  History of Present Illness:   Maria Lin is a 38 y.o. G63P2002 Caucasian female being seen today for heavy painful periods. Have always been that way, but much worse for last year. Regular periods, usually lasted 3d, started Wegovy  in June, has lost 7lbs. Now periods last 4-7d and starting 2-3d earlier. For past year changes tampon and pad q at heaviest, bleeds onto clothes, had clot size of forearm recently, very bad cramps- ibuprofen  +midol  doesn't touch it. Brownish-purple d/c and odor week before period, itching week after period. Denies abnormal discharge, itching/odor/irritation.  Wants hysterectomy.  Patient's last menstrual period was 06/07/2024. The current method of family planning is tubal ligation.  Last pap 39yrs ago. Results were: neg per pt     06/30/2024    8:31 AM 06/16/2024   11:58 AM 05/06/2024   11:45 AM 03/25/2024    2:03 PM 01/20/2019    2:57 PM  Depression screen PHQ 2/9  Decreased Interest 0 0 0 0 0  Down, Depressed, Hopeless 0 0 0 1 0  PHQ - 2 Score 0 0 0 1 0  Altered sleeping 0 2 0 2   Tired, decreased energy 0 0 3 2   Change in appetite 0 0 0 0   Feeling bad or failure about yourself  0 0 0 0   Trouble concentrating 0 0 0 0   Moving slowly or fidgety/restless 0 0 0 0   Suicidal thoughts 0 0 0 0   PHQ-9 Score 0 2 3 5    Difficult doing work/chores  Somewhat difficult Not difficult at all Not difficult at all         06/30/2024    8:31 AM 06/16/2024   11:59 AM 05/06/2024   11:46 AM 03/25/2024    2:03 PM  GAD 7 : Generalized Anxiety Score  Nervous, Anxious, on Edge 0 0 0 2  Control/stop worrying 0 0 0 1  Worry too much - different things 0 0 0 1  Trouble relaxing 0 0 0 0  Restless 0 0 0 0  Easily annoyed or irritable 0 0 0 2  Afraid - awful might happen 0 0 0 2  Total GAD 7 Score 0 0 0 8  Anxiety Difficulty  Not  difficult at all Not difficult at all Somewhat difficult     Review of Systems:   Pertinent items are noted in HPI Denies fever/chills, dizziness, headaches, visual disturbances, fatigue, shortness of breath, chest pain, abdominal pain, vomiting, abnormal vaginal discharge/itching/odor/irritation, problems with periods, bowel movements, urination, or intercourse unless otherwise stated above.  Pertinent History Reviewed:  Reviewed past medical,surgical, social, obstetrical and family history.  Reviewed problem list, medications and allergies. Physical Assessment:   Vitals:   06/30/24 0829  BP: 126/84  Pulse: 61  Weight: 224 lb 4.8 oz (101.7 kg)  Height: 5' 3 (1.6 m)  Body mass index is 39.73 kg/m.       Physical Examination:   General appearance: alert, well appearing, and in no distress  Mental status: alert, oriented to person, place, and time  Skin: warm & dry   Cardiovascular: normal heart rate noted  Respiratory: normal respiratory effort, no distress  Abdomen: soft, non-tender   Pelvic: VULVA: normal appearing vulva with no masses, tenderness or lesions, VAGINA: normal appearing vagina with normal color and discharge, no lesions, CERVIX: normal appearing  cervix without discharge or lesions, UTERUS: unable to adequately palpate d/t habitus, slightly tender, ADNEXA: unable to adequately palpate d/t habitus, slightly tender  Extremities: no edema   Chaperone: Peggy Dones  No results found for this or any previous visit (from the past 24 hours).  Assessment & Plan:  1) Menorrhagia w/ regular cycles, dysmenorrhea> CBC, CV swab, will get pelvic u/s and f/u w/ MD after- wants hysterectomy  Meds: No orders of the defined types were placed in this encounter.   Orders Placed This Encounter  Procedures   US  PELVIC COMPLETE WITH TRANSVAGINAL   CBC    Return for 3-4wks MD only to f/u on u/s and discuss options.  Suzen JONELLE Fetters CNM, [facility] 06/30/2024 9:26 AM

## 2024-07-01 ENCOUNTER — Ambulatory Visit: Payer: Self-pay | Admitting: Women's Health

## 2024-07-01 LAB — CBC
Hematocrit: 41.7 % (ref 34.0–46.6)
Hemoglobin: 13.7 g/dL (ref 11.1–15.9)
MCH: 30.4 pg (ref 26.6–33.0)
MCHC: 32.9 g/dL (ref 31.5–35.7)
MCV: 93 fL (ref 79–97)
Platelets: 349 x10E3/uL (ref 150–450)
RBC: 4.51 x10E6/uL (ref 3.77–5.28)
RDW: 12.9 % (ref 11.7–15.4)
WBC: 10.3 x10E3/uL (ref 3.4–10.8)

## 2024-07-01 LAB — CERVICOVAGINAL ANCILLARY ONLY
Bacterial Vaginitis (gardnerella): POSITIVE — AB
Candida Glabrata: NEGATIVE
Candida Vaginitis: NEGATIVE
Chlamydia: NEGATIVE
Comment: NEGATIVE
Comment: NEGATIVE
Comment: NEGATIVE
Comment: NEGATIVE
Comment: NEGATIVE
Comment: NORMAL
Neisseria Gonorrhea: NEGATIVE
Trichomonas: NEGATIVE

## 2024-07-01 MED ORDER — METRONIDAZOLE 500 MG PO TABS: 500.0000 mg | ORAL_TABLET | Freq: Two times a day (BID) | ORAL | 0 refills | Status: DC

## 2024-07-02 LAB — CYTOLOGY - PAP
Comment: NEGATIVE
Diagnosis: NEGATIVE
High risk HPV: NEGATIVE

## 2024-07-03 ENCOUNTER — Ambulatory Visit (HOSPITAL_COMMUNITY)
Admission: RE | Admit: 2024-07-03 | Discharge: 2024-07-03 | Disposition: A | Discharge status: Home / Self Care | Source: Ambulatory Visit | Attending: Women's Health | Admitting: Women's Health

## 2024-07-03 DIAGNOSIS — N920 Excessive and frequent menstruation with regular cycle: Secondary | ICD-10-CM | POA: Diagnosis not present

## 2024-07-03 DIAGNOSIS — N946 Dysmenorrhea, unspecified: Secondary | ICD-10-CM | POA: Diagnosis not present

## 2024-07-14 ENCOUNTER — Encounter: Payer: Self-pay | Admitting: Women's Health

## 2024-07-23 ENCOUNTER — Encounter: Payer: Self-pay | Admitting: Obstetrics & Gynecology

## 2024-07-23 ENCOUNTER — Ambulatory Visit: Admitting: Obstetrics & Gynecology

## 2024-07-23 VITALS — BP 132/85 | HR 72 | Ht 63.0 in | Wt 221.8 lb

## 2024-07-23 DIAGNOSIS — N920 Excessive and frequent menstruation with regular cycle: Secondary | ICD-10-CM

## 2024-07-23 DIAGNOSIS — Z9851 Tubal ligation status: Secondary | ICD-10-CM | POA: Diagnosis not present

## 2024-07-23 DIAGNOSIS — R102 Pelvic and perineal pain: Secondary | ICD-10-CM | POA: Diagnosis not present

## 2024-07-23 MED ORDER — GABAPENTIN 100 MG PO CAPS: 100.0000 mg | ORAL_CAPSULE | Freq: Three times a day (TID) | ORAL | 3 refills | Status: DC

## 2024-07-23 NOTE — Addendum Note (Signed)
 Addended by: MARILYNN DELON HERO on: 07/23/2024 05:49 PM   Modules accepted: Orders

## 2024-07-23 NOTE — Progress Notes (Addendum)
 GYN VISIT Patient name: Maria Lin MRN [id number]  Date of birth: Sep 12, 1986 Chief Complaint:   Follow-up (ultrasound)  History of Present Illness:   Maria Lin is a 38 y.o. [phone] female being seen today to address the following concerns:  HMB/pelvic pain: Last year around January, she notes worsening heavy periods.  Used to be 3 days, no 7+ days of bleeding.  Notes 4 heavy days where she may be changing a pad+tampon every 20-31min.  While the bleeding has not led to anemia, she reports that she is absolutely miserable.  She has not tried any medication to regulate her menses.  +dysmenorrhea and daily  Pain is on the left side and radiates across her lower abdomen.  Taking ibuprofen , midol  and heating pack, which will dull her pain.  Rates her pain an 8/10- with treatment rates her pain a 5/10- bearable.  Some constipation- started routine of miralax and laxative.  Currently on Wegovy .  Some nausea, no vomiting  On occasion may have light pink spotting for a day every now and then.    Used to be on COCs, prior to tubal ligation.  Since then she has not been on any medication to regulate her menses.  Prior 2016 operative note reviewed.  Normal uterus tubes and ovaries.  There was mention of omental adhesions from the anterior abdominal wall that were taken down.  Patient had mentioned that her doctor told her she had endometriosis; however, there is no documentation of that in any notes.  Patient notes migraines with aura.    Pelvic ultrasound reviewed: 06-2024: 9.4 x 5 x 4.7 uterus, 114 cc.  No masses or fibroids noted.  Normal ovaries bilaterally  Patient's last menstrual period was 06/07/2024.    Review of Systems:   Pertinent items are noted in HPI Denies fever/chills, dizziness, headaches, visual disturbances, fatigue, shortness of breath, chest pain. Some constipation Pertinent History Reviewed:   Past Surgical History:  Procedure Laterality Date    CESAREAN SECTION N/A 01/26/2013   Procedure: CESAREAN SECTION of baby girl   at 0613  APGAR 9/9;  Surgeon: Krystal Deaner, MD;  Location: [facility];  Service: Obstetrics;  Laterality: N/A;   LAPAROSCOPIC TUBAL LIGATION Bilateral 02/09/2015   Procedure: LAPAROSCOPIC TUBAL LIGATION;  Surgeon: Nathanel LELON Bunker, MD;  Location: [facility];  Service: Gynecology;  Laterality: Bilateral;   NO PAST SURGERIES     TUBAL LIGATION      Past Medical History:  Diagnosis Date   Anxiety    Depression    hx depression and pp depression   Migraines    PONV (postoperative nausea and vomiting)    with labor    Reviewed problem list, medications and allergies. Physical Assessment:   Vitals:   07/23/24 1029  BP: 132/85  Pulse: 72  Weight: 221 lb 12.8 oz (100.6 kg)  Height: 5' 3 (1.6 m)  Body mass index is 39.29 kg/m.       Physical Examination:   General appearance: alert, well appearing, and in no distress  Psych: mood appropriate, normal affect  Skin: warm & dry   Cardiovascular: normal heart rate noted  Respiratory: normal respiratory effort, no distress  Abdomen: soft, no rebound, no guarding.  Notes diffuse lower pelvic pain with palpation  Pelvic: examination not indicated  Extremities: no edema   Chaperone: N/A    Assessment & Plan:  1) HMB, pelvic pain - Due to history of migraines with aura, reviewed progesterone only options.  Discussed  Pops, Depo, IUD.  Discussed the reversibility of these options as well as the pros cons. - Discussed endometrial ablation.  Reviewed risk benefits and potential failure of procedure -Discussed that the above options will help to manage her bleeding which hopefully will also help to manage some of her pelvic pain. - Discussed potential option for surgical intervention via hysterectomy; however, discussed that I cannot guarantee that hysterectomy will alleviate her pain -Discussed robotic- assisted laparoscopic hysterectomy and bilateral salpingectomy,  possible cystoscopy -Explained that this surgery is performed to remove the uterus through several small incisions in the abdomen- pending anatomy 3-5 ports. I discussed the risks and benefits of the surgery, including, but not limited to risk of bleeding, including the need for blood transfusion, infection, damage to surrounding organs and tissues such as damage to bladder, ureter or bowel that would requiring additional procedures.  Reviewed with patient that unfortunately the ultrasound only provides some which information.  Discussed potential for adhesions whether it be due to prior surgery or endometriosis - Also discussed long term complications such as fistula or dehiscence requiring further surgical intervention.  Discussed possible need for conversion to an open procedure and potential for other complications that cannot be predicted. -reviewed same day procedure, hospital and home expectations/recovery -typical recovery 8-12 wks with several weeks off work and 8wks of pelvic rest -questions/concerns were addressed - For now patient plans to review her options and will let us  know once she has made a final decision - Also try gabapentin  as it seems as though her pain may be nerve specific  Meds ordered this encounter  Medications   gabapentin  (NEURONTIN ) 100 MG capsule    Sig: Take 1 capsule (100 mg total) by mouth 3 (three) times daily.    Dispense:  90 capsule    Refill:  3      Return for TBD.   Sadao Weyer, DO Attending Obstetrician & Gynecologist, [facility] for [employer], [facility]

## 2024-08-14 ENCOUNTER — Telehealth: Payer: Self-pay | Admitting: Obstetrics & Gynecology

## 2024-08-14 NOTE — Telephone Encounter (Signed)
 Patient cancelled pre op appointment here. She wants to put off surgery for a year due to financial reasons and needing to get back to work.

## 2024-08-17 DIAGNOSIS — R102 Pelvic and perineal pain: Secondary | ICD-10-CM | POA: Diagnosis not present

## 2024-08-17 DIAGNOSIS — R300 Dysuria: Secondary | ICD-10-CM | POA: Diagnosis not present

## 2024-08-17 DIAGNOSIS — M545 Low back pain, unspecified: Secondary | ICD-10-CM | POA: Diagnosis not present

## 2024-08-17 DIAGNOSIS — B3731 Acute candidiasis of vulva and vagina: Secondary | ICD-10-CM | POA: Diagnosis not present

## 2024-08-17 DIAGNOSIS — N898 Other specified noninflammatory disorders of vagina: Secondary | ICD-10-CM | POA: Diagnosis not present

## 2024-08-24 ENCOUNTER — Encounter: Payer: Self-pay | Admitting: Obstetrics & Gynecology

## 2024-08-24 ENCOUNTER — Encounter: Admitting: Obstetrics & Gynecology

## 2024-08-24 ENCOUNTER — Ambulatory Visit: Admitting: Obstetrics & Gynecology

## 2024-08-24 VITALS — BP 121/84 | Ht 62.0 in | Wt 219.4 lb

## 2024-08-24 DIAGNOSIS — N946 Dysmenorrhea, unspecified: Secondary | ICD-10-CM | POA: Diagnosis not present

## 2024-08-24 DIAGNOSIS — R102 Pelvic and perineal pain: Secondary | ICD-10-CM

## 2024-08-24 DIAGNOSIS — N920 Excessive and frequent menstruation with regular cycle: Secondary | ICD-10-CM

## 2024-08-24 DIAGNOSIS — N760 Acute vaginitis: Secondary | ICD-10-CM

## 2024-08-24 NOTE — Progress Notes (Signed)
 GYN VISIT Patient name: Maria Lin MRN [id number]  Date of birth: 13-Mar-1986 Chief Complaint:   Pre-op Exam  History of Present Illness:   Maria Lin is a 38 y.o. [id number] female being seen today for confirmation of upcoming surgery.  Pt desires to proceed with hysterectomy due to HMB and dysmenorrhea.  In review, she has been struggling with her menses over the past several months.    In review, menses are every month now lasting more than 7 days.  Reports 4 heavy days where she may have to change a pad plus tampon every 20 to 30 minutes.  + dysmenorrhea  Pelvic pain: Also notes left-sided lower abdominal pain with radiation.  Some improvement with ibuprofen , Midol , heating pack as well as gabapentin .  After review of her options, she wishes to proceed with permanent surgical intervention  Pt also notes h/o recurrent vaginitis- recently completed Diflcuan and Abx for UTI.  Records reviewed +yeast, BV negative though she does report history of BV in the past.  Patient's last menstrual period was 07/31/2024.    Review of Systems:   Pertinent items are noted in HPI Denies fever/chills, dizziness, headaches, visual disturbances, fatigue, shortness of breath, chest pain, abdominal pain, vomiting Pertinent History Reviewed:   Past Surgical History:  Procedure Laterality Date   CESAREAN SECTION N/A 01/26/2013   Procedure: CESAREAN SECTION of baby girl   at 9386  APGAR 9/9;  Surgeon: Krystal Deaner, MD;  Location: [facility];  Service: Obstetrics;  Laterality: N/A;   LAPAROSCOPIC TUBAL LIGATION Bilateral 02/09/2015   Procedure: LAPAROSCOPIC TUBAL LIGATION;  Surgeon: Nathanel LELON Bunker, MD;  Location: [facility];  Service: Gynecology;  Laterality: Bilateral;   NO PAST SURGERIES     TUBAL LIGATION      Past Medical History:  Diagnosis Date   Anxiety    Depression    hx depression and pp depression   Migraines    PONV (postoperative nausea and vomiting)    with labor     Reviewed problem list, medications and allergies. Physical Assessment:   Vitals:   08/24/24 1142  BP: 121/84  Weight: 219 lb 6.4 oz (99.5 kg)  Height: 5' 2 (1.575 m)  Body mass index is 40.13 kg/m.       Physical Examination:   General appearance: alert, well appearing, and in no distress  Psych: mood appropriate, normal affect  Skin: warm & dry   Cardiovascular: RRR  Respiratory: normal respiratory effort, no distress  Abdomen: soft, non-tender, no reproducible pain, no rebound or guarding  Pelvic: VULVA: normal appearing vulva with no masses, tenderness or lesions, VAGINA: normal appearing vagina with normal color and minimal white discharge, no lesions, CERVIX: normal appearing cervix without discharge or lesions, UTERUS: uterus is normal size, shape, consistency and nontender, ADNEXA: normal adnexa in size, nontender and no masses  Extremities: no edema   Chaperone: Patient declined    Assessment & Plan:  1) HMB, dysmenorrhea/pelvic pain - Reviewed conservative options such as IUD, medical management or minor procedure such as endometrial ablation.  At this time she desires to proceed with robotic assisted laparoscopic hysterectomy with salpingectomy for permanent surgical intervention -Explained that this surgery is performed to remove the uterus through several small incisions in the abdomen- pending anatomy 4-5 ports. I discussed the risks and benefits of the surgery, including, but not limited to risk of bleeding, including the need for blood transfusion, infection, damage to surrounding organs and tissues such as damage to bladder,  ureter or bowel that would requiring additional procedures.  Reviewed long term complications such as fistula or dehiscence requiring further surgical intervention.  -reviewed same day procedure -typical recovery 8-12 wks with several weeks off work and 8wks of pelvic rest -questions/concerns were addressed and plan to proceed as scheduled for  Oct 8  - Patient aware to stop Wegovy  at least 1 week prior to surgery - Patient to return for RN visit for vaginitis panel to confirm negative BV prior to surgery -preop labs to be ordered   No orders of the defined types were placed in this encounter.   Return for Needs RN visit 9/30 or 10/1 (vaginitis panel) and 1wk postop from 10/8.   Orissa Arreaga, DO Attending Obstetrician & Gynecologist, [facility] for [employer], [facility]

## 2024-08-26 ENCOUNTER — Encounter: Payer: Self-pay | Admitting: Obstetrics & Gynecology

## 2024-09-15 ENCOUNTER — Other Ambulatory Visit (HOSPITAL_COMMUNITY)
Admission: RE | Admit: 2024-09-15 | Discharge: 2024-09-15 | Disposition: A | Discharge status: Home / Self Care | Source: Ambulatory Visit | Attending: Obstetrics & Gynecology | Admitting: Obstetrics & Gynecology

## 2024-09-15 ENCOUNTER — Ambulatory Visit (INDEPENDENT_AMBULATORY_CARE_PROVIDER_SITE_OTHER)

## 2024-09-15 DIAGNOSIS — N760 Acute vaginitis: Secondary | ICD-10-CM

## 2024-09-15 NOTE — Progress Notes (Signed)
   NURSE VISIT- VAGINITIS  SUBJECTIVE:  Maria Lin is a 38 y.o. [id number] GYN patientfemale here for a vaginal swab for vaginitis screening.  She reports the following symptoms: none for 0 days. Denies abnormal vaginal bleeding, significant pelvic pain, fever, or UTI symptoms. Had office visit with MD who wanted her to get checked before Hysterectomy in a week.   OBJECTIVE:  LMP 07/31/2024   Appears well, in no apparent distress  ASSESSMENT: Vaginal swab for vaginitis screening  PLAN: Self-collected vaginal probe for Bacterial Vaginosis, Yeast sent to lab Treatment: to be determined once results are received Follow-up as needed if symptoms persist/worsen, or new symptoms develop  Maria Lin  09/15/2024 4:06 PM

## 2024-09-16 ENCOUNTER — Ambulatory Visit: Admitting: Family Medicine

## 2024-09-16 NOTE — Patient Instructions (Signed)
 Maria Lin  09/16/2024     @PREFPERIOPPHARMACY @   Your procedure is scheduled on 09/23/2024.   Report to [facility] at  [phone]  A.M.   Call this number if you have problems the morning of surgery:  [phone]  If you experience any cold or flu symptoms such as cough, fever, chills, shortness of breath, etc. between now and your scheduled surgery, please notify us  at the above number.   Remember:  Do not eat after midnight.    You may drink clear liquids until 0430 am on 09/23/2024.       Clear liquids allowed are:                    Water, Juice (No red color; non-citric and without pulp; diabetics please choose diet or no sugar options), Carbonated beverages (diabetics please choose diet or no sugar options), Clear Tea (No creamer, milk, or cream, including half & half and powdered creamer), Black Coffee Only (No creamer, milk or cream, including half & half and powdered creamer), and Clear Sports drink (No red color; diabetics please choose diet or no sugar options)    Take these medicines the morning of surgery with A SIP OF WATER                                       gabapentin , sertraline .    Do not wear jewelry, make-up or nail polish, including gel polish,  artificial nails, or any other type of covering on natural nails (fingers and  toes).  Do not wear lotions, powders, or perfumes, or deodorant.  Do not shave 48 hours prior to surgery.  Men may shave face and neck.  Do not bring valuables to the hospital.  [facility] is not responsible for any belongings or valuables.  Contacts, dentures or bridgework may not be worn into surgery.  Leave your suitcase in the car.  After surgery it may be brought to your room.  For patients admitted to the hospital, discharge time will be determined by your treatment team.  Patients discharged the day of surgery will not be allowed to drive home and must have someone with them for 24 hours.    Special instructions:    DO NOT smoke tobacco or vape for 24 hours before your procedure.  Please read over the following fact sheets that you were given. Pain Booklet, Coughing and Deep Breathing, Blood Transfusion Information, Surgical Site Infection Prevention, Anesthesia Post-op Instructions, and Care and Recovery After Surgery      Laparoscopically Assisted Vaginal Hysterectomy, Care After After a LAVH, it's common to have soreness and numbness in your incision areas. You'll have pain in your belly. You may also have: Bleeding and discharge from the vagina. Tiredness. Sadness and other emotions. If your ovaries were taken out, you may also have symptoms of menopause, such as hot flashes, night sweats, and lack of sleep. Follow these instructions at home: Medicines Take over-the-counter and prescription medicines only as told by your health care provider. If you were given antibiotics, take them as told by your provider. Do not stop taking the antibiotic even if you start to feel better. Ask your provider if the medicine prescribed to you: Requires you to avoid driving or using machinery. Can cause constipation. You may need to take these actions to prevent or treat constipation: Drink  enough fluid to keep your pee (urine) pale yellow. Take over-the-counter or prescription medicines. Eat foods that are high in fiber, such as beans, whole grains, and fresh fruits and vegetables. Limit foods that are high in fat and processed sugars, such as fried or sweet foods. Incision care  Follow instructions from your provider about how to take care of your incisions. Make sure you: Wash your hands with soap and water for at least 20 seconds before and after you change your bandage. If soap and water aren't available, use hand sanitizer. Change your dressing as told by your provider. Leave stitches, skin glue, or tape strips in place. These skin closures may need to stay in place for 2 weeks or longer. If tape strip  edges start to loosen and curl up, you may trim the loose edges. Do not remove tape strips completely unless your provider tells you to do that. Check your incision areas every day for signs of infection. Check for: More redness, swelling, or pain. More fluid or blood. Warmth. Pus or a bad smell. Activity  Rest as told by your provider. Do not sit for a long time without moving. Get up to take short walks every 1-2 hours. This will improve blood flow and breathing. Ask for help if you feel weak or unsteady. You may have to avoid lifting. Ask your provider how much you can safely lift. Return to your normal activities as told by your provider. Ask your provider what activities are safe for you. Lifestyle Do not use any products that contain nicotine or tobacco. These products include cigarettes, chewing tobacco, and vaping devices, such as e-cigarettes. These can delay healing after surgery. If you need help quitting, ask your provider. Do not drink alcohol until your provider approves. General instructions Do not douche, use tampons, have sex, or put anything in the vagina for at least 6 weeks. If you struggle with physical or emotional changes after your procedure, speak with your provider or a therapist. Do not take baths, swim, or use a hot tub until your provider approves. Ask your provider if you may take showers. You may only be allowed to take sponge baths. Try to have someone at home with you for the first 1-2 weeks to help with your daily chores. Wear compression stockings as told by your provider. These stockings help to prevent blood clots and reduce swelling in your legs. Your provider may give you more instructions. Make sure you know what you can and can't do. Contact a health care provider if: You have any signs of infection. You have pain and your pain medicine doesn't help. You feel dizzy or light-headed. You have trouble peeing. You vomit or feel like you may vomit, and  the symptoms do not go away. You have pus or discharge from your vagina that smells bad. Get help right away if: You have a fever and your symptoms suddenly get worse. You have very bad pain in the abdomen. You have chest pain or shortness of breath. You faint. You have pain, swelling, or redness in your leg. You have heavy bleeding in your vagina that soaks through a pad in less than 1 hour. You see blood clots in your bleeding. These symptoms may be an emergency. Get help right away. Call 911. Do not wait to see if the symptoms will go away. Do not drive yourself to the hospital. This information is not intended to replace advice given to you by your health care provider. Make  sure you discuss any questions you have with your health care provider. Document Revised: 03/15/2023 Document Reviewed: 03/15/2023 Elsevier Patient Education  2024 Elsevier Inc.General Anesthesia, Adult, Care After The following information offers guidance on how to care for yourself after your procedure. Your health care provider may also give you more specific instructions. If you have problems or questions, contact your health care provider. What can I expect after the procedure? After the procedure, it is common for people to: Have pain or discomfort at the IV site. Have nausea or vomiting. Have a sore throat or hoarseness. Have trouble concentrating. Feel cold or chills. Feel weak, sleepy, or tired (fatigue). Have soreness and body aches. These can affect parts of the body that were not involved in surgery. Follow these instructions at home: For the time period you were told by your health care provider:  Rest. Do not participate in activities where you could fall or become injured. Do not drive or use machinery. Do not drink alcohol. Do not take sleeping pills or medicines that cause drowsiness. Do not make important decisions or sign legal documents. Do not take care of children on your  own. General instructions Drink enough fluid to keep your urine pale yellow. If you have sleep apnea, surgery and certain medicines can increase your risk for breathing problems. Follow instructions from your health care provider about wearing your sleep device: Anytime you are sleeping, including during daytime naps. While taking prescription pain medicines, sleeping medicines, or medicines that make you drowsy. Return to your normal activities as told by your health care provider. Ask your health care provider what activities are safe for you. Take over-the-counter and prescription medicines only as told by your health care provider. Do not use any products that contain nicotine or tobacco. These products include cigarettes, chewing tobacco, and vaping devices, such as e-cigarettes. These can delay incision healing after surgery. If you need help quitting, ask your health care provider. Contact a health care provider if: You have nausea or vomiting that does not get better with medicine. You vomit every time you eat or drink. You have pain that does not get better with medicine. You cannot urinate or have bloody urine. You develop a skin rash. You have a fever. Get help right away if: You have trouble breathing. You have chest pain. You vomit blood. These symptoms may be an emergency. Get help right away. Call 911. Do not wait to see if the symptoms will go away. Do not drive yourself to the hospital. Summary After the procedure, it is common to have a sore throat, hoarseness, nausea, vomiting, or to feel weak, sleepy, or fatigue. For the time period you were told by your health care provider, do not drive or use machinery. Get help right away if you have difficulty breathing, have chest pain, or vomit blood. These symptoms may be an emergency. This information is not intended to replace advice given to you by your health care provider. Make sure you discuss any questions you have with  your health care provider. Document Revised: 03/02/2022 Document Reviewed: 03/02/2022 Elsevier Patient Education  2024 Elsevier Inc.How to Use Chlorhexidine  at Home in the Shower Chlorhexidine  gluconate (CHG) is a germ-killing (antiseptic) wash that's used to clean the skin. It can get rid of the germs that normally live on the skin and can keep them away for about 24 hours. If you're having surgery, you may be told to shower with CHG at home the night before surgery. This  can help lower your risk for infection. To use CHG wash in the shower, follow the steps below. Supplies needed: CHG body wash. Clean washcloth. Clean towel. How to use CHG in the shower Follow these steps unless you're told to use CHG in a different way: Start the shower. Use your normal soap and shampoo to wash your face and hair. Turn off the shower or move out of the shower stream. Pour CHG onto a clean washcloth. Do not use any type of brush or rough sponge. Start at your neck, washing your body down to your toes. Make sure you: Wash the part of your body where the surgery will be done for at least 1 minute. Do not scrub. Do not use CHG on your head or face unless your health care provider tells you to. If it gets into your ears or eyes, rinse them well with water. Do not wash your genitals with CHG. Wash your back and under your arms. Make sure to wash skin folds. Let the CHG sit on your skin for 1-2 minutes or as long as told. Rinse your entire body in the shower, including all body creases and folds. Turn off the shower. Dry off with a clean towel. Do not put anything on your skin afterward, such as powder, lotion, or perfume. Put on clean clothes or pajamas. If it's the night before surgery, sleep in clean sheets. General tips Use CHG only as told, and follow the instructions on the label. Use the full amount of CHG as told. This is often one bottle. Do not smoke and stay away from flames after using  CHG. Your skin may feel sticky after using CHG. This is normal. The sticky feeling will go away as the CHG dries. Do not use CHG: If you have a chlorhexidine  allergy or have reacted to chlorhexidine  in the past. On open wounds or areas of skin that have broken skin, cuts, or scrapes. On babies younger than 76 months of age. Contact a health care provider if: You have questions about using CHG. Your skin gets irritated or itchy. You have a rash after using CHG. You swallow any CHG. Call your local poison control center [phone] in the [place] Your eyes itch badly, or they become very red or swollen. Your hearing changes. You have trouble seeing. If you can't reach your provider, go to an urgent care or emergency room. Do not drive yourself. Get help right away if: You have swelling or tingling in your mouth or throat. You make high-pitched whistling sounds when you breathe, most often when you breathe out (wheeze). You have trouble breathing. These symptoms may be an emergency. Call 911 right away. Do not wait to see if the symptoms will go away. Do not drive yourself to the hospital. This information is not intended to replace advice given to you by your health care provider. Make sure you discuss any questions you have with your health care provider. Document Revised: 06/18/2023 Document Reviewed: 06/14/2022 Elsevier Patient Education  2024 [employer].

## 2024-09-18 ENCOUNTER — Ambulatory Visit: Payer: Self-pay | Admitting: Obstetrics & Gynecology

## 2024-09-18 ENCOUNTER — Other Ambulatory Visit: Payer: Self-pay

## 2024-09-18 ENCOUNTER — Encounter (HOSPITAL_COMMUNITY)
Admission: RE | Admit: 2024-09-18 | Discharge: 2024-09-18 | Disposition: A | Discharge status: Home / Self Care | Source: Ambulatory Visit | Attending: Obstetrics & Gynecology | Admitting: Obstetrics & Gynecology

## 2024-09-18 ENCOUNTER — Other Ambulatory Visit: Payer: Self-pay | Admitting: Obstetrics & Gynecology

## 2024-09-18 ENCOUNTER — Encounter (HOSPITAL_COMMUNITY): Payer: Self-pay

## 2024-09-18 VITALS — BP 110/80 | HR 68 | Resp 18 | Ht 62.0 in | Wt 219.4 lb

## 2024-09-18 DIAGNOSIS — N920 Excessive and frequent menstruation with regular cycle: Secondary | ICD-10-CM | POA: Insufficient documentation

## 2024-09-18 DIAGNOSIS — I4510 Unspecified right bundle-branch block: Secondary | ICD-10-CM | POA: Diagnosis not present

## 2024-09-18 DIAGNOSIS — Z6841 Body Mass Index (BMI) 40.0 and over, adult: Secondary | ICD-10-CM

## 2024-09-18 DIAGNOSIS — B9689 Other specified bacterial agents as the cause of diseases classified elsewhere: Secondary | ICD-10-CM

## 2024-09-18 DIAGNOSIS — Z01818 Encounter for other preprocedural examination: Secondary | ICD-10-CM | POA: Diagnosis not present

## 2024-09-18 DIAGNOSIS — R102 Pelvic and perineal pain unspecified side: Secondary | ICD-10-CM | POA: Insufficient documentation

## 2024-09-18 HISTORY — DX: Unspecified asthma, uncomplicated: J45.909

## 2024-09-18 LAB — BASIC METABOLIC PANEL WITH GFR
Anion gap: 10 (ref 5–15)
BUN: 13 mg/dL (ref 6–20)
CO2: 24 mmol/L (ref 22–32)
Calcium: 9.4 mg/dL (ref 8.9–10.3)
Chloride: 106 mmol/L (ref 98–111)
Creatinine, Ser: 0.62 mg/dL (ref 0.44–1.00)
GFR, Estimated: >60 mL/min (ref >60)
Glucose, Bld: 97 mg/dL (ref 70–99)
Potassium: 4.3 mmol/L (ref 3.5–5.1)
Sodium: 140 mmol/L (ref 135–145)

## 2024-09-18 LAB — CERVICOVAGINAL ANCILLARY ONLY
Bacterial Vaginitis (gardnerella): POSITIVE — AB
Candida Glabrata: NEGATIVE
Candida Vaginitis: NEGATIVE
Comment: NEGATIVE
Comment: NEGATIVE
Comment: NEGATIVE

## 2024-09-18 LAB — CBC
HCT: 38.8 % (ref 36.0–46.0)
Hemoglobin: 12.8 g/dL (ref 12.0–15.0)
MCH: 29.6 pg (ref 26.0–34.0)
MCHC: 33 g/dL (ref 30.0–36.0)
MCV: 89.8 fL (ref 80.0–100.0)
Platelets: 346 K/uL (ref 150–400)
RBC: 4.32 MIL/uL (ref 3.87–5.11)
RDW: 12.4 % (ref 11.5–15.5)
WBC: 7.7 K/uL (ref 4.0–10.5)
nRBC: 0 % (ref 0.0–0.2)

## 2024-09-18 LAB — TYPE AND SCREEN
ABO/RH(D): O NEG
Antibody Screen: NEGATIVE

## 2024-09-18 LAB — PREGNANCY, URINE: Preg Test, Ur: NEGATIVE

## 2024-09-18 MED ORDER — METRONIDAZOLE 500 MG PO TABS: 500.0000 mg | ORAL_TABLET | Freq: Two times a day (BID) | ORAL | 2 refills | Status: AC

## 2024-09-18 NOTE — Progress Notes (Signed)
 Rx for BV  Myna Hidalgo, DO Attending Obstetrician & Gynecologist, [facility] for [employer], [facility]

## 2024-09-22 MED ORDER — GENTAMICIN SULFATE 40 MG/ML IJ SOLN
5.0000 mg/kg | INTRAVENOUS | Status: AC
  Administered 2024-09-23: 349.6 mg via INTRAVENOUS
  Filled 2024-09-22: qty 8.75

## 2024-09-22 MED ORDER — CLINDAMYCIN PHOSPHATE 900 MG/50ML IV SOLN
900.0000 mg | INTRAVENOUS | Status: AC
  Administered 2024-09-23: 900 mg via INTRAVENOUS
  Filled 2024-09-22: qty 50

## 2024-09-22 NOTE — H&P (Signed)
 [facility] Attending History and Physical  Maria Lin is a 38 y.o. [id number] who presents for scheduled robotic assisted laparoscopic hysterectomy, bilateral salpingectomy, possible cystoscopy  In review, she has been struggling with heavy menstrual bleeding.  She notes that menses last for more than 7 days with at least 4 heavy days changing a pad plus tampon every 20 to 30 minutes.   + dysmenorrhea mostly on her left side with radiation.  Using over-the-counter medication including ibuprofen  and Midol  and heating pack.  At this time she has completed childbearing and desires to proceed with permanent surgical intervention  Of note patient has history of recurrent BV, she has recently been treated.  Denies any abnormal vaginal discharge, fevers, chills, sweats, dysuria, nausea, vomiting, other GI or GU symptoms or other general symptoms.  Past Medical History:  Diagnosis Date   Anxiety    Asthma    Depression    hx depression and pp depression   Migraines    PONV (postoperative nausea and vomiting)    with labor    Past Surgical History:  Procedure Laterality Date   CESAREAN SECTION N/A 01/26/2013   Procedure: CESAREAN SECTION of baby girl   at 9386  APGAR 9/9;  Surgeon: Krystal Deaner, MD;  Location: [facility];  Service: Obstetrics;  Laterality: N/A;   LAPAROSCOPIC TUBAL LIGATION Bilateral 02/09/2015   Procedure: LAPAROSCOPIC TUBAL LIGATION;  Surgeon: Nathanel LELON Bunker, MD;  Location: [facility];  Service: Gynecology;  Laterality: Bilateral;   NO PAST SURGERIES     TUBAL LIGATION     OB History  Gravida Para Term Preterm AB Living  2 2 2   2   SAB IAB Ectopic Multiple Live Births      2    # Outcome Date GA Lbr Len/2nd Weight Sex Type Anes PTL Lv  2 Term 01/26/13 [redacted]w[redacted]d 19:20 / 02:53 3495 g F CS-LTranv EPI  LIV  1 Term 2012 [redacted]w[redacted]d  2977 g M Vag-Spont   LIV     Birth Comments: seizures due to lack of oxygen spent a week at [facility] episodes of  hypotension during delivery   Patient denies any other pertinent gynecologic issues.  No current facility-administered medications on file prior to encounter.   Current Outpatient Medications on File Prior to Encounter  Medication Sig Dispense Refill   gabapentin  (NEURONTIN ) 100 MG capsule Take 1 capsule (100 mg total) by mouth 3 (three) times daily. (Patient taking differently: Take 100 mg by mouth 2 (two) times daily as needed (pain).) 90 capsule 3   Multiple Vitamin (MULTIVITAMIN WITH MINERALS) TABS tablet Take 1 tablet by mouth daily.     sertraline  (ZOLOFT ) 50 MG tablet Take 1 tablet (50 mg total) by mouth daily. Take 1/2 tablet for 6 days and then increase to 1 tablet daily. 90 tablet 2   metroNIDAZOLE  (FLAGYL ) 500 MG tablet Take 1 tablet (500 mg total) by mouth 2 (two) times daily. (Patient not taking: Reported on 07/23/2024) 14 tablet 0   Semaglutide -Weight Management (WEGOVY ) 0.5 MG/0.5ML SOAJ Inject 0.5 mg into the skin once a week. X4 weeks (Patient not taking: Reported on 09/15/2024) 2 mL 0   Semaglutide -Weight Management (WEGOVY ) 1.7 MG/0.75ML SOAJ Inject 1.7 mg into the skin once a week. (Patient not taking: Reported on 09/15/2024) 3 mL 0   Semaglutide -Weight Management (WEGOVY ) 2.4 MG/0.75ML SOAJ Inject 2.4 mg into the skin once a week. (Patient not taking: No sig reported) 3 mL 0   Allergies  Allergen Reactions   Codeine Nausea And Vomiting   Penicillins Hives   Vicodin [Hydrocodone-Acetaminophen ] Nausea And Vomiting    Social History:   reports that she has quit smoking. Her smoking use included cigarettes. She has a 1.9 pack-year smoking history. She has never used smokeless tobacco. She reports that she does not currently use alcohol. She reports that she does not use drugs. Family History  Problem Relation Age of Onset   Other Father        allergic to novacaine   Hypertension Father    Stroke Father    Heart Problems Daughter    Autism Son    Anxiety disorder Son     Hypertension Maternal Aunt    COPD Maternal Aunt    Multiple sclerosis Maternal Aunt    Breast cancer Maternal Aunt    Heart attack Maternal Uncle    Alcohol abuse Maternal Uncle    Cancer Paternal Aunt        ductile carcinoma   Breast cancer Paternal Aunt    Multiple sclerosis Paternal Aunt    Paget's disease of bone Paternal Aunt    Cervical cancer Maternal Grandmother    Ovarian cancer Maternal Grandmother    Colon cancer Maternal Grandmother    Colon cancer Maternal Grandfather    Hypertension Paternal Grandmother    Bone cancer Paternal Grandmother    Stroke Paternal Grandfather    Heart disease Paternal Grandfather    Heart attack Paternal Grandfather    Hypertension Paternal Grandfather    Diabetes Paternal Grandfather     Review of Systems: Pertinent items noted in HPI and remainder of comprehensive ROS otherwise negative.  PHYSICAL EXAM: Blood pressure 133/76, pulse 77, temperature 98.2 F (36.8 C), temperature source Oral, resp. rate (!) 23, height 5' 2 (1.575 m), weight 99.5 kg, last menstrual period 09/21/2024, SpO2 100%. CONSTITUTIONAL: Well-developed, well-nourished female in no acute distress.  SKIN: Skin is warm and dry. No rash noted. Not diaphoretic. No erythema. No pallor. NEUROLOGIC: Alert and oriented to person, place, and time. Normal reflexes, muscle tone coordination. No cranial nerve deficit noted. PSYCHIATRIC: Normal mood and affect. Normal behavior. Normal judgment and thought content. CARDIOVASCULAR: Normal heart rate noted, regular rhythm RESPIRATORY: Effort and breath sounds normal, no problems with respiration noted ABDOMEN: Soft, nontender, nondistended. PELVIC: deferred MUSCULOSKELETAL: no calf tenderness bilaterally EXT: no edema bilaterally, normal pulses  Labs: Results for orders placed or performed during the hospital encounter of 09/18/24 (from the past 2 weeks)  CBC   Collection Time: 09/18/24 11:41 AM  Result Value Ref Range   WBC  7.7 4.0 - 10.5 K/uL   RBC 4.32 3.87 - 5.11 MIL/uL   Hemoglobin 12.8 12.0 - 15.0 g/dL   HCT 61.1 63.9 - 53.9 %   MCV 89.8 80.0 - 100.0 fL   MCH 29.6 26.0 - 34.0 pg   MCHC 33.0 30.0 - 36.0 g/dL   RDW 87.5 88.4 - 84.4 %   Platelets 346 150 - 400 K/uL   nRBC 0.0 0.0 - 0.2 %  Basic metabolic panel   Collection Time: 09/18/24 11:41 AM  Result Value Ref Range   Sodium 140 135 - 145 mmol/L   Potassium 4.3 3.5 - 5.1 mmol/L   Chloride 106 98 - 111 mmol/L   CO2 24 22 - 32 mmol/L   Glucose, Bld 97 70 - 99 mg/dL   BUN 13 6 - 20 mg/dL   Creatinine, Ser 9.37 0.44 - 1.00 mg/dL   Calcium  9.4 8.9 - 10.3 mg/dL   GFR, Estimated >39 >39 mL/min   Anion gap 10 5 - 15  Pregnancy, urine   Collection Time: 09/18/24 11:41 AM  Result Value Ref Range   Preg Test, Ur NEGATIVE NEGATIVE  Type and screen   Collection Time: 09/18/24 11:41 AM  Result Value Ref Range   ABO/RH(D) O NEG    Antibody Screen NEG    Sample Expiration      10/02/2024,2359 Performed at [facility], [address] [place], [place] [address]   Results for orders placed or performed in visit on 09/15/24 (from the past 2 weeks)  Cervicovaginal ancillary only   Collection Time: 09/15/24  4:06 PM  Result Value Ref Range   Bacterial Vaginitis (gardnerella) Positive (A)    Candida Vaginitis Negative    Candida Glabrata Negative    Comment      Normal Reference Range Bacterial Vaginosis - Negative   Comment Normal Reference Range Candida Species - Negative    Comment Normal Reference Range Candida Galbrata - Negative     Imaging Studies: 06/2024: 9.4 x 5 x 4.7 cm uterus measuring 114 cc.  Fibroids or masses noted.  Normal ovaries bilaterally  Assessment: Heavy menstrual bleeding, dysmenorrhea  Plan: Robotic assisted laparoscopic hysterectomy, bilateral salpingectomy, possible cystoscopy -Gent/Clinda IV -IV Toradol  -NPO -LR @ 125cc/hr -SCDs to OR -Risk/benefits and alternatives reviewed with the patient including but not  limited to risk of bleeding, infection and injury to surrounding organ requiring further surgical intervention- please see prior note for full review of risk/benefit and complication.  Informed consent was obtained.  Questions and concerns were addressed and pt desires to proceed.  To OR when ready  Jaycee Pelzer, DO Attending Obstetrician & Gynecologist, [facility] for [employer], [facility]

## 2024-09-23 ENCOUNTER — Encounter (HOSPITAL_COMMUNITY)
Admission: RE | Disposition: A | Discharge status: Home / Self Care | Payer: Self-pay | Source: Home / Self Care | Attending: Obstetrics & Gynecology

## 2024-09-23 ENCOUNTER — Ambulatory Visit (HOSPITAL_COMMUNITY)

## 2024-09-23 ENCOUNTER — Other Ambulatory Visit: Payer: Self-pay

## 2024-09-23 ENCOUNTER — Ambulatory Visit (HOSPITAL_COMMUNITY)
Admission: RE | Admit: 2024-09-23 | Discharge: 2024-09-23 | Disposition: A | Discharge status: Home / Self Care | Attending: Obstetrics & Gynecology | Admitting: Obstetrics & Gynecology

## 2024-09-23 ENCOUNTER — Encounter (HOSPITAL_COMMUNITY): Payer: Self-pay | Admitting: Obstetrics & Gynecology

## 2024-09-23 DIAGNOSIS — N920 Excessive and frequent menstruation with regular cycle: Secondary | ICD-10-CM | POA: Diagnosis not present

## 2024-09-23 DIAGNOSIS — N939 Abnormal uterine and vaginal bleeding, unspecified: Secondary | ICD-10-CM | POA: Diagnosis not present

## 2024-09-23 DIAGNOSIS — N840 Polyp of corpus uteri: Secondary | ICD-10-CM

## 2024-09-23 DIAGNOSIS — R102 Pelvic and perineal pain unspecified side: Secondary | ICD-10-CM

## 2024-09-23 DIAGNOSIS — Z87891 Personal history of nicotine dependence: Secondary | ICD-10-CM | POA: Insufficient documentation

## 2024-09-23 DIAGNOSIS — N8003 Adenomyosis of the uterus: Secondary | ICD-10-CM

## 2024-09-23 DIAGNOSIS — J45909 Unspecified asthma, uncomplicated: Secondary | ICD-10-CM | POA: Diagnosis not present

## 2024-09-23 DIAGNOSIS — E66813 Obesity, class 3: Secondary | ICD-10-CM | POA: Diagnosis not present

## 2024-09-23 DIAGNOSIS — N946 Dysmenorrhea, unspecified: Secondary | ICD-10-CM

## 2024-09-23 DIAGNOSIS — Z01818 Encounter for other preprocedural examination: Secondary | ICD-10-CM

## 2024-09-23 DIAGNOSIS — Z6841 Body Mass Index (BMI) 40.0 and over, adult: Secondary | ICD-10-CM | POA: Diagnosis not present

## 2024-09-23 SURGERY — HYSTERECTOMY, TOTAL, LAPAROSCOPIC, ROBOT-ASSISTED WITH SALPINGECTOMY
Anesthesia: General | Site: Abdomen | Laterality: Bilateral

## 2024-09-23 MED ORDER — DEXAMETHASONE SODIUM PHOSPHATE 10 MG/ML IJ SOLN
INTRAMUSCULAR | Status: DC | PRN
  Administered 2024-09-23: 10 mg via INTRAVENOUS

## 2024-09-23 MED ORDER — PROPOFOL 10 MG/ML IV BOLUS
INTRAVENOUS | Status: DC | PRN
  Administered 2024-09-23: 200 mg via INTRAVENOUS

## 2024-09-23 MED ORDER — ROCURONIUM BROMIDE 10 MG/ML (PF) SYRINGE
PREFILLED_SYRINGE | INTRAVENOUS | Status: AC
  Filled 2024-09-23: qty 10

## 2024-09-23 MED ORDER — SODIUM CHLORIDE 0.9 % IV BOLUS: 500.0000 mL | Freq: Once | INTRAVENOUS | Status: DC

## 2024-09-23 MED ORDER — BUPIVACAINE HCL (PF) 0.25 % IJ SOLN
INTRAMUSCULAR | Status: DC | PRN
  Administered 2024-09-23: 40 mL

## 2024-09-23 MED ORDER — DEXMEDETOMIDINE HCL IN NACL 80 MCG/20ML IV SOLN
INTRAVENOUS | Status: AC
  Filled 2024-09-23: qty 20

## 2024-09-23 MED ORDER — KETOROLAC TROMETHAMINE 30 MG/ML IJ SOLN: 30.0000 mg | INTRAMUSCULAR | Status: AC

## 2024-09-23 MED ORDER — 0.9 % SODIUM CHLORIDE (POUR BTL) OPTIME
TOPICAL | Status: DC | PRN
  Administered 2024-09-23: 500 mL

## 2024-09-23 MED ORDER — GABAPENTIN 300 MG PO CAPS: 300.0000 mg | ORAL_CAPSULE | Freq: Three times a day (TID) | ORAL | 0 refills | Status: AC

## 2024-09-23 MED ORDER — CHLORHEXIDINE GLUCONATE 0.12 % MT SOLN
OROMUCOSAL | Status: AC
  Administered 2024-09-23: 15 mL via OROMUCOSAL
  Filled 2024-09-23: qty 15

## 2024-09-23 MED ORDER — OXYCODONE HCL 5 MG/5ML PO SOLN: 5.0000 mg | Freq: Once | ORAL | Status: AC | PRN

## 2024-09-23 MED ORDER — SCOPOLAMINE 1 MG/3DAYS TD PT72: 1.0000 | MEDICATED_PATCH | Freq: Once | TRANSDERMAL | Status: DC

## 2024-09-23 MED ORDER — ORAL CARE MOUTH RINSE: 15.0000 mL | Freq: Once | OROMUCOSAL | Status: AC

## 2024-09-23 MED ORDER — ONDANSETRON HCL 4 MG/2ML IJ SOLN
4.0000 mg | Freq: Once | INTRAMUSCULAR | Status: AC | PRN
  Administered 2024-09-23: 4 mg via INTRAVENOUS

## 2024-09-23 MED ORDER — ACETAMINOPHEN 325 MG PO TABS: 650.0000 mg | ORAL_TABLET | Freq: Four times a day (QID) | ORAL | Status: AC | PRN

## 2024-09-23 MED ORDER — ONDANSETRON HCL 4 MG/2ML IJ SOLN
INTRAMUSCULAR | Status: AC
  Filled 2024-09-23: qty 2

## 2024-09-23 MED ORDER — DEXAMETHASONE SODIUM PHOSPHATE 10 MG/ML IJ SOLN
INTRAMUSCULAR | Status: AC
Start: 2024-09-23 — End: 2024-09-23
  Filled 2024-09-23: qty 1

## 2024-09-23 MED ORDER — PHENYLEPHRINE 80 MCG/ML (10ML) SYRINGE FOR IV PUSH (FOR BLOOD PRESSURE SUPPORT)
PREFILLED_SYRINGE | INTRAVENOUS | Status: DC | PRN
Start: 2024-09-23 — End: 2024-09-23
  Administered 2024-09-23 (×3): 80 ug via INTRAVENOUS

## 2024-09-23 MED ORDER — SUGAMMADEX SODIUM 200 MG/2ML IV SOLN
INTRAVENOUS | Status: DC | PRN
  Administered 2024-09-23: 200 mg via INTRAVENOUS

## 2024-09-23 MED ORDER — HEMOSTATIC AGENTS (NO CHARGE) OPTIME
TOPICAL | Status: DC | PRN
Start: 2024-09-23 — End: 2024-09-23
  Administered 2024-09-23: 1 via TOPICAL

## 2024-09-23 MED ORDER — SCOPOLAMINE 1 MG/3DAYS TD PT72
MEDICATED_PATCH | TRANSDERMAL | Status: AC
  Administered 2024-09-23: 1 mg via TRANSDERMAL
  Filled 2024-09-23: qty 1

## 2024-09-23 MED ORDER — ONDANSETRON HCL 4 MG/2ML IJ SOLN
INTRAMUSCULAR | Status: AC
Start: 2024-09-23 — End: 2024-09-23
  Filled 2024-09-23: qty 2

## 2024-09-23 MED ORDER — STERILE WATER FOR IRRIGATION IR SOLN
Status: DC | PRN
  Administered 2024-09-23: 500 mL

## 2024-09-23 MED ORDER — MIDAZOLAM HCL 2 MG/2ML IJ SOLN
INTRAMUSCULAR | Status: AC
  Filled 2024-09-23: qty 2

## 2024-09-23 MED ORDER — DIPHENHYDRAMINE HCL 50 MG/ML IJ SOLN
25.0000 mg | Freq: Once | INTRAMUSCULAR | Status: AC
  Administered 2024-09-23: 25 mg via INTRAVENOUS

## 2024-09-23 MED ORDER — OXYCODONE HCL 5 MG PO TABS
5.0000 mg | ORAL_TABLET | Freq: Once | ORAL | Status: AC | PRN
  Administered 2024-09-23: 5 mg via ORAL
  Filled 2024-09-23: qty 1

## 2024-09-23 MED ORDER — IBUPROFEN 600 MG PO TABS: 600.0000 mg | ORAL_TABLET | Freq: Four times a day (QID) | ORAL | 0 refills | Status: AC | PRN

## 2024-09-23 MED ORDER — FENTANYL CITRATE PF 50 MCG/ML IJ SOSY: 25.0000 ug | PREFILLED_SYRINGE | INTRAMUSCULAR | Status: DC | PRN

## 2024-09-23 MED ORDER — FENTANYL CITRATE (PF) 250 MCG/5ML IJ SOLN
INTRAMUSCULAR | Status: DC | PRN
  Administered 2024-09-23: 50 ug via INTRAVENOUS
  Administered 2024-09-23 (×2): 100 ug via INTRAVENOUS

## 2024-09-23 MED ORDER — TRAMADOL HCL 50 MG PO TABS: 50.0000 mg | ORAL_TABLET | Freq: Four times a day (QID) | ORAL | 0 refills | Status: AC | PRN

## 2024-09-23 MED ORDER — LACTATED RINGERS IV SOLN: INTRAVENOUS | Status: DC

## 2024-09-23 MED ORDER — EPHEDRINE 5 MG/ML INJ
INTRAVENOUS | Status: AC
  Filled 2024-09-23: qty 5

## 2024-09-23 MED ORDER — ACETAMINOPHEN 10 MG/ML IV SOLN
1000.0000 mg | Freq: Once | INTRAVENOUS | Status: AC
  Administered 2024-09-23: 1000 mg via INTRAVENOUS
  Filled 2024-09-23: qty 100

## 2024-09-23 MED ORDER — POVIDONE-IODINE 10 % EX SWAB
2.0000 | Freq: Once | CUTANEOUS | Status: DC
Start: 2024-09-23 — End: 2024-09-23

## 2024-09-23 MED ORDER — ONDANSETRON 4 MG PO TBDP: 4.0000 mg | ORAL_TABLET | Freq: Three times a day (TID) | ORAL | 0 refills | Status: DC | PRN

## 2024-09-23 MED ORDER — LIDOCAINE 2% (20 MG/ML) 5 ML SYRINGE
INTRAMUSCULAR | Status: AC
  Filled 2024-09-23: qty 5

## 2024-09-23 MED ORDER — LIDOCAINE 2% (20 MG/ML) 5 ML SYRINGE
INTRAMUSCULAR | Status: AC
  Filled 2024-09-23: qty 10

## 2024-09-23 MED ORDER — DEXAMETHASONE SODIUM PHOSPHATE 4 MG/ML IJ SOLN
8.0000 mg | Freq: Once | INTRAMUSCULAR | Status: AC
  Administered 2024-09-23: 8 mg via INTRAVENOUS

## 2024-09-23 MED ORDER — DEXMEDETOMIDINE HCL IN NACL 80 MCG/20ML IV SOLN
INTRAVENOUS | Status: DC | PRN
  Administered 2024-09-23: 20 ug via INTRAVENOUS

## 2024-09-23 MED ORDER — MIDAZOLAM HCL 2 MG/2ML IJ SOLN
INTRAMUSCULAR | Status: DC | PRN
Start: 2024-09-23 — End: 2024-09-23
  Administered 2024-09-23: 2 mg via INTRAVENOUS

## 2024-09-23 MED ORDER — KETOROLAC TROMETHAMINE 30 MG/ML IJ SOLN
INTRAMUSCULAR | Status: AC
  Administered 2024-09-23: 30 mg via INTRAVENOUS
  Filled 2024-09-23: qty 1

## 2024-09-23 MED ORDER — DEXAMETHASONE SODIUM PHOSPHATE 10 MG/ML IJ SOLN
INTRAMUSCULAR | Status: AC
  Filled 2024-09-23: qty 1

## 2024-09-23 MED ORDER — DOCUSATE SODIUM 100 MG PO CAPS: 100.0000 mg | ORAL_CAPSULE | Freq: Two times a day (BID) | ORAL | 0 refills | Status: AC

## 2024-09-23 MED ORDER — BUPIVACAINE HCL (PF) 0.25 % IJ SOLN
INTRAMUSCULAR | Status: AC
  Filled 2024-09-23: qty 60

## 2024-09-23 MED ORDER — CHLORHEXIDINE GLUCONATE 0.12 % MT SOLN: 15.0000 mL | Freq: Once | OROMUCOSAL | Status: AC

## 2024-09-23 MED ORDER — PROPOFOL 10 MG/ML IV BOLUS
INTRAVENOUS | Status: AC
  Filled 2024-09-23: qty 20

## 2024-09-23 MED ORDER — GLYCOPYRROLATE PF 0.2 MG/ML IJ SOSY
PREFILLED_SYRINGE | INTRAMUSCULAR | Status: DC | PRN
  Administered 2024-09-23: .2 mg via INTRAVENOUS

## 2024-09-23 MED ORDER — FENTANYL CITRATE (PF) 250 MCG/5ML IJ SOLN
INTRAMUSCULAR | Status: AC
  Filled 2024-09-23: qty 5

## 2024-09-23 MED ORDER — LIDOCAINE 2% (20 MG/ML) 5 ML SYRINGE
INTRAMUSCULAR | Status: DC | PRN
Start: 2024-09-23 — End: 2024-09-23
  Administered 2024-09-23: 100 mg via INTRAVENOUS

## 2024-09-23 MED ORDER — ONDANSETRON HCL 4 MG/2ML IJ SOLN
INTRAMUSCULAR | Status: AC
  Filled 2024-09-23: qty 4

## 2024-09-23 MED ORDER — ROCURONIUM BROMIDE 10 MG/ML (PF) SYRINGE
PREFILLED_SYRINGE | INTRAVENOUS | Status: DC | PRN
Start: 2024-09-23 — End: 2024-09-23
  Administered 2024-09-23: 10 mg via INTRAVENOUS
  Administered 2024-09-23: 70 mg via INTRAVENOUS
  Administered 2024-09-23: 10 mg via INTRAVENOUS

## 2024-09-23 MED ORDER — SODIUM CHLORIDE 0.9 % IR SOLN
Status: DC | PRN
  Administered 2024-09-23: 3000 mL

## 2024-09-23 MED ORDER — ONDANSETRON HCL 4 MG/2ML IJ SOLN
INTRAMUSCULAR | Status: DC | PRN
Start: 2024-09-23 — End: 2024-09-23
  Administered 2024-09-23: 4 mg via INTRAVENOUS

## 2024-09-23 MED ORDER — GLYCOPYRROLATE PF 0.2 MG/ML IJ SOSY
PREFILLED_SYRINGE | INTRAMUSCULAR | Status: AC
  Filled 2024-09-23: qty 1

## 2024-09-23 MED ORDER — CHLORHEXIDINE GLUCONATE 0.12 % MT SOLN: 15.0000 mL | Freq: Once | OROMUCOSAL | Status: DC

## 2024-09-23 MED ORDER — DEXAMETHASONE SODIUM PHOSPHATE 4 MG/ML IJ SOLN
INTRAMUSCULAR | Status: AC
  Filled 2024-09-23: qty 2

## 2024-09-23 MED ORDER — DIPHENHYDRAMINE HCL 50 MG/ML IJ SOLN
INTRAMUSCULAR | Status: AC
  Filled 2024-09-23: qty 1

## 2024-09-23 SURGICAL SUPPLY — 54 items
BAG URINE DRAIN 2000ML AR STRL (UROLOGICAL SUPPLIES) ×4 IMPLANT
BLADE SURG SZ11 CARB STEEL (BLADE) ×4 IMPLANT
CATH FOLEY 3WAY 30CC 16FR (CATHETERS) ×4 IMPLANT
CAUTERY HOOK MNPLR 1.6 DVNC XI (INSTRUMENTS) ×4 IMPLANT
CHLORAPREP W/TINT 26 (MISCELLANEOUS) ×4 IMPLANT
COVER LIGHT HANDLE (MISCELLANEOUS) ×2 IMPLANT
COVER LIGHT HANDLE STERIS (MISCELLANEOUS) ×8 IMPLANT
COVER MAYO STAND XLG (MISCELLANEOUS) ×4 IMPLANT
DERMABOND ADVANCED .7 DNX12 (GAUZE/BANDAGES/DRESSINGS) ×4 IMPLANT
DRAPE ARM DVNC X/XI (DISPOSABLE) ×16 IMPLANT
DRAPE COLUMN DVNC XI (DISPOSABLE) ×4 IMPLANT
DRAPE UTILITY W/TAPE 26X15 (DRAPES) ×8 IMPLANT
DRIVER NDL MEGA 8 DVNC XI (INSTRUMENTS) ×6 IMPLANT
DRIVER NDLE MEGA DVNC XI (INSTRUMENTS) ×6 IMPLANT
ELECTRODE REM PT RTRN 9FT ADLT (ELECTROSURGICAL) ×4 IMPLANT
FORCEPS PROGRASP DVNC XI (FORCEP) ×4 IMPLANT
GAUZE 4X4 16PLY ~~LOC~~+RFID DBL (SPONGE) ×8 IMPLANT
GLOVE BIO SURGEON STRL SZ 6.5 (GLOVE) ×13 IMPLANT
GLOVE BIOGEL PI IND STRL 7.0 (GLOVE) ×24 IMPLANT
GOWN STRL REUS W/ TWL LRG LVL3 (GOWN DISPOSABLE) ×13 IMPLANT
GOWN STRL REUS W/TWL LRG LVL3 (GOWN DISPOSABLE) ×8 IMPLANT
IRRIGATOR SUCT 8 DISP DVNC XI (IRRIGATION / IRRIGATOR) ×1 IMPLANT
KIT PINK PAD W/HEAD ARM REST (MISCELLANEOUS) ×4 IMPLANT
KIT TURNOVER CYSTO (KITS) ×4 IMPLANT
MANIFOLD NEPTUNE II (INSTRUMENTS) ×4 IMPLANT
NDL HYPO 25X1 1.5 SAFETY (NEEDLE) ×3 IMPLANT
NDL INSUFFLATION 14GA 120MM (NEEDLE) ×3 IMPLANT
NEEDLE HYPO 25X1 1.5 SAFETY (NEEDLE) ×3 IMPLANT
NEEDLE INSUFFLATION 14GA 120MM (NEEDLE) ×3 IMPLANT
NS IRRIG 500ML POUR BTL (IV SOLUTION) ×4 IMPLANT
OBTURATOR OPTICALSTD 8 DVNC (TROCAR) ×4 IMPLANT
PACK PERI GYN (CUSTOM PROCEDURE TRAY) ×4 IMPLANT
POSITIONER HEAD 8X9X4 ADT (SOFTGOODS) ×4 IMPLANT
POWDER SURGICEL 3.0 GRAM (HEMOSTASIS) ×4 IMPLANT
RUMI II GYRUS 3.5CM BLUE (DISPOSABLE) ×1 IMPLANT
SCISSORS MNPLR CVD DVNC XI (INSTRUMENTS) ×1 IMPLANT
SEAL UNIV 5-12 XI (MISCELLANEOUS) ×13 IMPLANT
SEALER VESSEL EXT DVNC XI (MISCELLANEOUS) ×4 IMPLANT
SET BASIN LINEN APH (SET/KITS/TRAYS/PACK) ×4 IMPLANT
SET TRI-LUMEN FLTR TB AIRSEAL (TUBING) ×4 IMPLANT
SOLUTION ANTFG W/FOAM PAD STRL (MISCELLANEOUS) ×1 IMPLANT
SPONGE T-LAP 18X18 ~~LOC~~+RFID (SPONGE) ×1 IMPLANT
SUT MNCRL AB 4-0 PS2 18 (SUTURE) ×5 IMPLANT
SUT STRATA PDS 0 30 CT-2.5 (SUTURE) ×7 IMPLANT
SUT VIC AB 0 CT1 27XBRD ANBCTR (SUTURE) ×1 IMPLANT
SYR 10ML LL (SYRINGE) ×8 IMPLANT
SYR 20ML LL LF (SYRINGE) ×4 IMPLANT
SYR 50ML LL SCALE MARK (SYRINGE) ×4 IMPLANT
SYR CONTROL 10ML LL (SYRINGE) ×8 IMPLANT
SYR TOOMEY 50ML (SYRINGE) ×4 IMPLANT
TIP ENDOSCOPIC SURGICEL (TIP) ×4 IMPLANT
TIP UTERINE 6.7X8CM BLUE DISP (MISCELLANEOUS) ×1 IMPLANT
TROCAR PORT AIRSEAL 8X120 (TROCAR) ×4 IMPLANT
WATER STERILE IRR 500ML POUR (IV SOLUTION) ×4 IMPLANT

## 2024-09-23 NOTE — Transfer of Care (Signed)
 Immediate Anesthesia Transfer of Care Note  Patient: Maria Lin  Procedure(s) Performed: HYSTERECTOMY, TOTAL, LAPAROSCOPIC, ROBOT-ASSISTED WITH SALPINGECTOMY (Bilateral: Abdomen) LYSIS, ADHESIONS, ROBOT-ASSISTED, LAPAROSCOPIC (Abdomen)  Patient Location: PACU  Anesthesia Type:General  Level of Consciousness: awake, drowsy, and patient cooperative  Airway & Oxygen Therapy: Patient Spontanous Breathing and Patient connected to face mask oxygen  Post-op Assessment: Report given to RN, Post -op Vital signs reviewed and stable, and Patient moving all extremities X 4  Post vital signs: Reviewed and stable  Last Vitals:  Vitals Value Taken Time  BP 94/53 09/23/24 11:30  Temp    Pulse 53 09/23/24 11:38  Resp 20 09/23/24 11:38  SpO2 99 % 09/23/24 11:38  Vitals shown include unfiled device data.  Last Pain:  Vitals:   09/23/24 0818  TempSrc:   PainSc: 0-No pain      Patients Stated Pain Goal: 4 (09/23/24 0754)  Complications: No notable events documented.

## 2024-09-23 NOTE — Anesthesia Preprocedure Evaluation (Addendum)
 Anesthesia Evaluation  Patient identified by MRN, date of birth, ID band Patient awake    Reviewed: Allergy & Precautions, H&P , NPO status , Patient's Chart, lab work & pertinent test results  History of Anesthesia Complications (+) PONV and history of anesthetic complications  Airway Mallampati: II  TM Distance: >3 FB Neck ROM: Full    Dental no notable dental hx.    Pulmonary asthma , former smoker   Pulmonary exam normal breath sounds clear to auscultation       Cardiovascular negative cardio ROS Normal cardiovascular exam Rhythm:Regular Rate:Normal     Neuro/Psych  Headaches PSYCHIATRIC DISORDERS Anxiety Depression       GI/Hepatic negative GI ROS, Neg liver ROS,,,  Endo/Other    Class 3 obesity  Renal/GU negative Renal ROS  negative genitourinary   Musculoskeletal negative musculoskeletal ROS (+)    Abdominal   Peds negative pediatric ROS (+)  Hematology negative hematology ROS (+)   Anesthesia Other Findings   Reproductive/Obstetrics negative OB ROS                              Anesthesia Physical Anesthesia Plan  ASA: 2  Anesthesia Plan: General   Post-op Pain Management:    Induction: Intravenous  PONV Risk Score and Plan: Scopolamine  patch - Pre-op  Airway Management Planned: Oral ETT  Additional Equipment:   Intra-op Plan:   Post-operative Plan: Extubation in OR  Informed Consent: I have reviewed the patients History and Physical, chart, labs and discussed the procedure including the risks, benefits and alternatives for the proposed anesthesia with the patient or authorized representative who has indicated his/her understanding and acceptance.     Dental advisory given  Plan Discussed with: CRNA  Anesthesia Plan Comments:          Anesthesia Quick Evaluation

## 2024-09-23 NOTE — Discharge Instructions (Addendum)
 Post Operative Pain Med Plan:  >Take gabapentin  300 mg three times per day, as prescribed for 4 days, try to space them evenly  >Take Ibuprofen  600mg  every 6 hours round the clock for the first 3 days with food.  After 3 days you can switch to as needed.  >In between the Toradol , take Tylenol  (over the counter) every 6 to 8 hours.  If the Tylenol  does not seem strong enough.  Take the tylenol  along with the Tramadol every 6 hours If the Tramadol seems to strong then just take Tylenol   >Tramadol will cause constipation, please be sure to take a stool softener (Colace) twice daily while taking this pain medication and/or continue this medication until your bowel regimen returns to normal  If possible try to take the Ibuprofen  with food to help avoid upsetting your stomach  >Use a heating pad as well as needed  >I have also sent a prescription for zofran  (ondansetron ) for nausea to take if needed over the first couple of days  >Be gentle with your diet the first few days, liquids and soft non spicy food, fruits are great  >Get up and move, no lifting or straining  HOME INSTRUCTIONS  Please note any unusual or excessive bleeding, pain, swelling. Mild dizziness or drowsiness are normal for about 24 hours after surgery.   Shower when comfortable  Restrictions: No driving for 24 hours or while taking pain medications.  Activity:  No heavy lifting (> 10 lbs), nothing in vagina (no tampons, douching, or intercourse) x 8 weeks; no tub baths for 8 weeks Vaginal spotting is expected but if your bleeding is heavy, period like,  please call the office   Incision: the bandaids will fall off when they are ready to; you may clean your incision with mild soap and water but do not rub or scrub the incision site.  You may experience slight bloody drainage from your incision periodically.  This is normal.  If you experience a large amount of drainage or the incision opens, please call your physician who  will likely direct you to the emergency department.  Diet:  You may return to your regular diet.  Do not eat large meals.  Eat small frequent meals throughout the day.  Continue to drink a good amount of water at least 6-8 glasses of water per day, hydration is very important for the healing process.  Pain Management: Follow the instructions as above  Always take prescription pain medication with food.  Tramadol may cause constipation, you may want to take a stool softener while taking this medication.  A prescription of colace has been sent in to take twice daily if needed while taking the oxycodone .  Be sure to drink plenty of fluids and increase your fiber to help with constipation.  Alcohol -- Avoid for 24 hours and while taking pain medications.  Nausea: Take sips of ginger ale or soda  Fever -- Call physician if temperature over 101 degrees  Follow up:  If you do not already have a follow up appointment scheduled, please call the office at [phone].  If you experience fever (a temperature greater than 100.4), pain unrelieved by pain medication, shortness of breath, swelling of a single leg, or any other symptoms which are concerning to you please the office immediately.

## 2024-09-23 NOTE — Anesthesia Postprocedure Evaluation (Signed)
 Anesthesia Post Note  Patient: Maria Lin  Procedure(s) Performed: HYSTERECTOMY, TOTAL, LAPAROSCOPIC, ROBOT-ASSISTED WITH SALPINGECTOMY (Bilateral: Abdomen) LYSIS, ADHESIONS, ROBOT-ASSISTED, LAPAROSCOPIC (Abdomen)  Patient location during evaluation: PACU Anesthesia Type: General Level of consciousness: awake and alert Pain management: pain level controlled Vital Signs Assessment: post-procedure vital signs reviewed and stable Respiratory status: spontaneous breathing, nonlabored ventilation, respiratory function stable and patient connected to nasal cannula oxygen Cardiovascular status: blood pressure returned to baseline and stable Postop Assessment: no apparent nausea or vomiting Anesthetic complications: no   No notable events documented.   Last Vitals:  Vitals:   09/23/24 1130 09/23/24 1145  BP: (!) 94/53 105/71  Pulse: (!) 51 78  Resp: 10 12  Temp:    SpO2: 95% 99%    Last Pain:  Vitals:   09/23/24 1149  TempSrc:   PainSc: 0-No pain                 Andrea Limes

## 2024-09-23 NOTE — Progress Notes (Signed)
 Pt arrived to post up around 1332 and soon c/o having nausea and BP was 88/59.  Dr. Herschell was notified and new orders received for bolus of LR and IV zofran  4mg .   While in post op, pt later c/o itching and mother who was with pt reported pt had discoloration on face.  Petechiae observed to forehead and down left side of neck.  Dr. Herschell was notified and came to assess pt.  Orders received for Bendadryl 25mg  and Decadron  8mg  which were administered around 1440.  Pt then approx 15 min later, c/o heart rate feeling low and pressure on chest.  Heart rate in the 60's.  Pt had been having a lot of gas/belching since arriving to post op.  Dr. Herschell was notified again and EKG performed. Results showed NSR and Dr. Herschell was notified.  BP came up to 98/65 at 1538 and pt reported feeling better and denied having any nausea, pain or chest tightness.  Dr. Ozan came by and talked with the pt and family in post op.  Discharge instructions were given to pt and her husband and mother with understanding verbalized.

## 2024-09-23 NOTE — Anesthesia Procedure Notes (Signed)
 Procedure Name: Intubation Date/Time: 09/23/2024 8:29 AM  Performed by: Cordella Elvie HERO, CRNAPre-anesthesia Checklist: Patient identified, Emergency Drugs available, Suction available, Patient being monitored and Timeout performed Patient Re-evaluated:Patient Re-evaluated prior to induction Oxygen Delivery Method: Circle system utilized Preoxygenation: Pre-oxygenation with 100% oxygen Induction Type: IV induction Ventilation: Mask ventilation without difficulty Laryngoscope Size: Mac and 3 Grade View: Grade I Tube type: Oral Tube size: 7.0 mm Number of attempts: 1 Airway Equipment and Method: Stylet Placement Confirmation: ETT inserted through vocal cords under direct vision, positive ETCO2, CO2 detector and breath sounds checked- equal and bilateral Secured at: 22 cm Tube secured with: Tape Dental Injury: Teeth and Oropharynx as per pre-operative assessment

## 2024-09-23 NOTE — Op Note (Signed)
 Pre Op Dx:   -heavy menstrual bleeding -Pelvic pain  Post Op Dx:   same  Procedure:   Robotic Assisted Total Laparoscopic Hysterectomy and Bilateral Salpingectomy and lysis of adhesions   Surgeon:  Dr. Delon Prude Assistants:  Earnestine Sharps, RNFA Anesthesia:  general   EBL:  80cc  IVF:  1100cc UOP:  100cc   Drains:  Foley catheter Specimen removed:  Uterus with cervix, bilateral fallopian tubes Findings:  Normal sized uterus with normal bilateral ovaries.  Prior salpingectomy noted.  Normal distal fallopian tubes.  Dense adhesions noted at vesicouterine junction. Complications: None  Description of procedure:  After informed consent the patient was taken to the operating room and placed in dorsal supine position where general endotracheal anesthesia was administered and found to be adequate.  She was placed in dorsal lithotomy position with her arms tucked.  She was prepped and draped in the usual sterile fashion.  A timeout was called and the procedure confirmed.  A RUMI uterine manipulator with the Koh cup and a Foley catheter were placed.   An incision was made in the supraumbilical area and the Veress needle was inserted into the abdominal cavity without difficulty. Proper placement was confirmed using the saline drop test and opening pressure was 7 mmHg. A pneumoperitoneum was obtained. The laparoscopic trocar and the laparoscope were placed under direct visualization.  Three additional 7mm ports were placed on either side of the umbilicus and an 8mm port was placed in the left upper quadrant under direct visualization.  The patient was placed in Trendelenburg position and the [employer] robotic device was docked.  Next, attention was turned to the console where the hysterectomy was performed.  The right fallopian tube was divided at the mesosalpinx and the uteroovarian anastamosis was divided and the right round ligament was divided.  This process was repeated on the contralateral  side.  Due to the dense adhesions, the bladder was back filled to better delineate the vesicouterine junction.  Using both electrocautery and sharp dissection the bladder was resected off the anterior portion of the uterus.  A small posterior colpotomy incision was made to mark the Koh cup. The uterine artery and vein were skeletonized and desiccated superior to the Koh cup.  This process was repeated on the contralateral side.  Care was then taken to ensure adequate dissection of the vesicouterine space.  An anterior colpotomy incision was made and colpotomy was completed along the ridge of the Koh cup and the uterus was passed off the field. The vaginal occluder was placed in the vagina to maintain pneumoperitoneum.  The vaginal cuff was then closed with V-loc suture in a double layer fashion. Suction/irrigation was completed and hemostasis confirmed. The right ureter was easily visualized, left ureter was not well seen due to adhesions of the descending colon to the left side wall.  However,in matching anatomical location of the right, ureter appeared remote from our operating field.   The [employer] Vinc[employer] robotic device was undocked.  Arista powder was placed on the vaginal cuff and adnexa bilaterally.  Under direct visualization TAP block was completed under direct visualization using 10cc of 0.25% marcaine  in each of four locations.  Airseal was deflated and trocars were removed.The skin was closed with 4-0 Vicryl in subcuticular fashion with skin glue placed atop each port site.    The patient was returned to dorsal supine position, awakened and extubated in the OR having appeared to tolerate the procedure well.  All sponge, needle, and  instrument counts were correct x 2 at the end of the case.  Pt tolerated procedure well and was taken to recovery in stable condition.   Nene Aranas, DO Attending Obstetrician & Gynecologist, [facility] for [employer], [facility]

## 2024-09-24 ENCOUNTER — Encounter (HOSPITAL_COMMUNITY): Payer: Self-pay | Admitting: Obstetrics & Gynecology

## 2024-09-24 ENCOUNTER — Ambulatory Visit: Payer: Self-pay | Admitting: Obstetrics & Gynecology

## 2024-09-24 LAB — SURGICAL PATHOLOGY

## 2024-09-25 ENCOUNTER — Telehealth: Payer: Self-pay | Admitting: Obstetrics & Gynecology

## 2024-09-25 NOTE — Telephone Encounter (Signed)
-   called patient -pain doing well -now passing flatus and had BM -rash/flushing has started to clear up with benadryl  -no acute concerns -f/u next week as scheduled with Dr. Eure  Aimi Essner, DO Attending Obstetrician & Gynecologist, [facility] for [facility], [facility]

## 2024-09-28 ENCOUNTER — Telehealth: Payer: Self-pay | Admitting: Obstetrics & Gynecology

## 2024-09-28 NOTE — Telephone Encounter (Signed)
 Patient believes she got a yeast infection from the medication given after surgery. Please advise.

## 2024-09-29 ENCOUNTER — Ambulatory Visit (INDEPENDENT_AMBULATORY_CARE_PROVIDER_SITE_OTHER)

## 2024-09-29 ENCOUNTER — Other Ambulatory Visit (HOSPITAL_COMMUNITY)
Admission: RE | Admit: 2024-09-29 | Discharge: 2024-09-29 | Disposition: A | Discharge status: Home / Self Care | Source: Ambulatory Visit | Attending: Obstetrics & Gynecology | Admitting: Obstetrics & Gynecology

## 2024-09-29 DIAGNOSIS — N898 Other specified noninflammatory disorders of vagina: Secondary | ICD-10-CM | POA: Diagnosis present

## 2024-09-29 DIAGNOSIS — N9489 Other specified conditions associated with female genital organs and menstrual cycle: Secondary | ICD-10-CM

## 2024-09-29 NOTE — Progress Notes (Signed)
   NURSE VISIT- VAGINITIS  SUBJECTIVE:  Maria Lin is a 38 y.o. [id number] GYN patientfemale here for a vaginal swab for vaginitis screening.  She reports the following symptoms: burning, discharge described as white, local irritation, and vulvar itching since having taken antibiotics after surgery. Denies abnormal vaginal bleeding, significant pelvic pain, fever, or UTI symptoms.  OBJECTIVE:  LMP 08/25/2024 (Approximate)   Appears well, in no apparent distress  ASSESSMENT: Vaginal swab for vaginitis screening  PLAN: Self-collected vaginal probe for Bacterial Vaginosis, Yeast sent to lab Treatment: to be determined once results are received Follow-up as needed if symptoms persist/worsen, or new symptoms develop  Aleck FORBES Blase  09/29/2024 8:47 AM

## 2024-09-30 ENCOUNTER — Ambulatory Visit: Payer: Self-pay | Admitting: Adult Health

## 2024-09-30 LAB — CERVICOVAGINAL ANCILLARY ONLY
Bacterial Vaginitis (gardnerella): NEGATIVE
Candida Glabrata: NEGATIVE
Candida Vaginitis: NEGATIVE
Comment: NEGATIVE
Comment: NEGATIVE
Comment: NEGATIVE

## 2024-10-01 ENCOUNTER — Ambulatory Visit (INDEPENDENT_AMBULATORY_CARE_PROVIDER_SITE_OTHER): Admitting: Obstetrics & Gynecology

## 2024-10-01 ENCOUNTER — Encounter: Payer: Self-pay | Admitting: Obstetrics & Gynecology

## 2024-10-01 VITALS — BP 127/86 | HR 79 | Ht 62.0 in | Wt 225.0 lb

## 2024-10-01 DIAGNOSIS — N9089 Other specified noninflammatory disorders of vulva and perineum: Secondary | ICD-10-CM

## 2024-10-01 DIAGNOSIS — Z9889 Other specified postprocedural states: Secondary | ICD-10-CM

## 2024-10-01 MED ORDER — FLUOCINONIDE EMULSIFIED BASE 0.05 % EX CREA: 1.0000 | TOPICAL_CREAM | Freq: Two times a day (BID) | CUTANEOUS | 1 refills | Status: AC

## 2024-10-01 NOTE — Progress Notes (Signed)
  HPI: Patient returns for routine postoperative follow-up having undergone      ICD-10-CM   1. Post-operative state: S/P RA TLH + BS 09/23/24 (Dr Marilynn)  S01.109     2. Vulvar irritation: Likely inflammatory from prep, no signs of yeast, treated with GV today anyway and Rx for lidex E twice a day  N90.89        The patient's immediate postoperative recovery has been unremarkable. Since hospital discharge the patient reports external irritation/itching Swab was negative.   Current Outpatient Medications: acetaminophen  (TYLENOL ) 325 MG tablet, Take 2 tablets (650 mg total) by mouth every 6 (six) hours as needed., Disp: , Rfl:  docusate sodium  (COLACE) 100 MG capsule, Take 1 capsule (100 mg total) by mouth 2 (two) times daily for 20 days. (Patient taking differently: Take 100 mg by mouth daily.), Disp: 40 capsule, Rfl: 0 fluocinonide-emollient (LIDEX-E) 0.05 % cream, Apply 1 Application topically 2 (two) times daily., Disp: 15 g, Rfl: 1 gabapentin  (NEURONTIN ) 300 MG capsule, Take 1 capsule (300 mg total) by mouth 3 (three) times daily for 4 days. (Patient taking differently: Take 100 mg by mouth daily as needed.), Disp: 12 capsule, Rfl: 0 ibuprofen  (ADVIL ) 600 MG tablet, Take 1 tablet (600 mg total) by mouth every 6 (six) hours as needed., Disp: 30 tablet, Rfl: 0 Multiple Vitamin (MULTIVITAMIN WITH MINERALS) TABS tablet, Take 1 tablet by mouth daily., Disp: , Rfl:  ondansetron  (ZOFRAN -ODT) 4 MG disintegrating tablet, Take 1 tablet (4 mg total) by mouth every 8 (eight) hours as needed., Disp: 20 tablet, Rfl: 0 sertraline  (ZOLOFT ) 50 MG tablet, Take 1 tablet (50 mg total) by mouth daily. Take 1/2 tablet for 6 days and then increase to 1 tablet daily., Disp: 90 tablet, Rfl: 2 Semaglutide -Weight Management (WEGOVY ) 0.5 MG/0.5ML SOAJ, Inject 0.5 mg into the skin once a week. X4 weeks (Patient not taking: Reported on 10/01/2024), Disp: 2 mL, Rfl: 0 Semaglutide -Weight Management (WEGOVY ) 1.7 MG/0.75ML  SOAJ, Inject 1.7 mg into the skin once a week. (Patient not taking: Reported on 10/01/2024), Disp: 3 mL, Rfl: 0 Semaglutide -Weight Management (WEGOVY ) 2.4 MG/0.75ML SOAJ, Inject 2.4 mg into the skin once a week. (Patient not taking: Reported on 10/01/2024), Disp: 3 mL, Rfl: 0  No current facility-administered medications for this visit.    Blood pressure 127/86, pulse 79, height 5' 2 (1.575 m), weight 225 lb (102.1 kg), last menstrual period 08/25/2024.  Physical Exam: Incisions all clean dry intact, benign abdomen Vulvar erythema, no evidence of yeast Topical treatment with GV to cover  Diagnostic Tests:   Pathology: benign  Impression + Management plan:   ICD-10-CM   1. Post-operative state: S/P RA TLH + BS 09/23/24 (Dr Marilynn)  S01.109     2. Vulvar irritation: Likely inflammatory from prep, no signs of yeast, treated with GV today anyway and Rx for lidex E twice a day  N90.89           Medications Prescribed this encounter: Orders Placed This Encounter     fluocinonide-emollient (LIDEX-E) 0.05 % cream         Sig: Apply 1 Application topically 2 (two) times daily.         Dispense:  15 g         Refill:  1     Follow up: No follow-ups on file.    Maria VEAR Inch, MD Attending Physician for the [facility] for [facility] 10/01/2024 10:22 AM

## 2024-10-07 ENCOUNTER — Telehealth: Payer: Self-pay | Admitting: *Deleted

## 2024-10-07 MED ORDER — TRIAMCINOLONE ACETONIDE 0.5 % EX OINT: 1.0000 | TOPICAL_OINTMENT | Freq: Two times a day (BID) | CUTANEOUS | 0 refills | Status: AC

## 2024-10-07 NOTE — Telephone Encounter (Signed)
 Pts insurance will not cover lidex.The preferred meds are betamethasone ointment or cream, or triamcinolone cream, lotion or ointment.

## 2024-10-26 ENCOUNTER — Other Ambulatory Visit (HOSPITAL_COMMUNITY): Payer: Self-pay

## 2024-11-09 ENCOUNTER — Encounter: Payer: Self-pay | Admitting: Obstetrics & Gynecology

## 2024-11-09 ENCOUNTER — Ambulatory Visit (INDEPENDENT_AMBULATORY_CARE_PROVIDER_SITE_OTHER): Admitting: Obstetrics & Gynecology

## 2024-11-09 VITALS — BP 139/87 | HR 60 | Ht 62.0 in | Wt 230.0 lb

## 2024-11-09 DIAGNOSIS — Z4889 Encounter for other specified surgical aftercare: Secondary | ICD-10-CM

## 2024-11-09 NOTE — Progress Notes (Signed)
    PostOp Visit Note  Maria Lin is a 38 y.o. G25P2002 female who presents for a postoperative visit. She is 7 weeks postop following a RAH,BS completed on 10/7  Prior vaginal  irritation now resolved.  She did not some light spotting around week 4, but that has since resolved.    She has had some cramping, rates her pain 4/10.   Sharp pain on her left side seemed to be related to something touching that side. Today she notes that the pain has since subsided. Denies fever or chills.  Tolerating gen diet.  +Flatus.  Having some constipation issues, but doing well with Colace.  Overall doing well  Review of Systems Pertinent items are noted in HPI.    Objective:  LMP 08/25/2024 (Approximate)    Physical Examination:  GENERAL ASSESSMENT: well developed and well nourished SKIN: Warm and dry CHEST: normal air exchange, respiratory effort normal with no retractions HEART: regular rate and rhythm ABDOMEN: soft, non-distended, +BS INCISION: well healed- C/D/I- no evidence of hernia or abnormalities noted GU: Normal external genitalia, vaginal pink moist mucosa with no bleeding or discharge noted.  Vaginal cuff appears intact no abnormalities seen.  On bimanual exam cuff well-healed no abnormalities appreciated. EXTREMITY: No calf tenderness PSYCH: mood appropriate, normal affect       Assessment:    S/p RAH, BS on 10/8   Plan:   -Eating milestones appropriately - Reassured patient that some vasomotor symptoms may be normal, currently using herbal supplements - Vaginal cuff well-healed, may return to full regular activity including intercourse - Cussed with patient that Pap smears are no longer indicated since hysterectomy was completed for benign indications  Juan Olthoff, DO Attending Obstetrician & Gynecologist, [facility] [employer], [facility]

## 2024-11-10 ENCOUNTER — Encounter: Admitting: Obstetrics & Gynecology

## 2024-12-02 ENCOUNTER — Other Ambulatory Visit: Payer: Self-pay | Admitting: Family Medicine

## 2024-12-02 DIAGNOSIS — F411 Generalized anxiety disorder: Secondary | ICD-10-CM

## 2024-12-02 DIAGNOSIS — F339 Major depressive disorder, recurrent, unspecified: Secondary | ICD-10-CM

## 2025-01-11 ENCOUNTER — Ambulatory Visit: Payer: Self-pay | Admitting: Family Medicine

## 2025-01-11 NOTE — Telephone Encounter (Signed)
 FYI Only or Action Required?: Action required by provider: clinical question for provider and update on patient condition.  Patient was last seen in primary care on 06/16/2024 by Severa Rock HERO, FNP.  Called Nurse Triage reporting Vomiting.  Symptoms began yesterday.  Interventions attempted: OTC medications: Pepto and Prescription medications: Zofran .  Symptoms are: gradually worsening.  Triage Disposition: See Physician Within 24 Hours  Patient/caregiver understands and will follow disposition?: Yes  Message from [facility] sent at 01/11/2025 11:30 AM EST  Reason for Triage: Patient stopped taking wegovy  because she had surgery and she took her next dosage yesterday and has Uncontrolled vomiting and constipated. Callback number  [phone]   Reason for Disposition  [1] MILD or MODERATE vomiting AND [2] present > 48 hours (2 days)  (Exception: Mild vomiting with associated diarrhea.)  Answer Assessment - Initial Assessment Questions 1. VOMITING SEVERITY: How many times have you vomited in the past 24 hours?      10-15 times  2. ONSET: When did the vomiting begin?      Yesterday  3. FLUIDS: What fluids or food have you vomited up today? Have you been able to keep any fluids down?     Drinking fluids, however, vomiting back up  4. ABDOMEN PAIN: Are your having any abdomen pain? If Yes : How bad is it and what does it feel like? (e.g., crampy, dull, intermittent, constant)       Yes, abdominal pain where injection is  5. DIARRHEA: Is there any diarrhea? If Yes, ask: How many times today?  Constipation  7. CAUSE: What do you think is causing your vomiting?     Started back on Wegovy  after stopping in October. Pt states she is taking 1.7 mg.   8. HYDRATION STATUS: Any signs of dehydration? (e.g., dry mouth [not only dry lips], too weak to stand) When did you last urinate?     Weakness and dry mouth  9. OTHER SYMPTOMS: Do you have any other symptoms?  (e.g., fever, headache, vertigo, vomiting blood or coffee grounds, recent head injury)   Pt reports taking Wegovy  after stopping in Oct. Pt states she started back yesterday on 1.7 mg and did not know she should have consulted with PCP before starting back. Pt c/o excessive vomiting and constipation Pt offered an appt for 01.27.26 Pt is taking OTC pepto and zofran  that has not helped Pt states she may not be able to leave the home and come back safely due to wether conditions.  Pt agrees with plan of care, will call back for any worsening symptoms  Protocols used: Vomiting-A-AH

## 2025-01-12 ENCOUNTER — Telehealth: Admitting: Family Medicine

## 2025-01-12 ENCOUNTER — Encounter: Payer: Self-pay | Admitting: Family Medicine

## 2025-01-12 DIAGNOSIS — T50995A Adverse effect of other drugs, medicaments and biological substances, initial encounter: Secondary | ICD-10-CM

## 2025-01-12 DIAGNOSIS — K219 Gastro-esophageal reflux disease without esophagitis: Secondary | ICD-10-CM | POA: Diagnosis not present

## 2025-01-12 DIAGNOSIS — T50905A Adverse effect of unspecified drugs, medicaments and biological substances, initial encounter: Secondary | ICD-10-CM

## 2025-01-12 DIAGNOSIS — R112 Nausea with vomiting, unspecified: Secondary | ICD-10-CM

## 2025-01-12 MED ORDER — PROMETHAZINE HCL 25 MG RE SUPP: 25.0000 mg | Freq: Four times a day (QID) | RECTAL | 0 refills | Status: AC | PRN

## 2025-01-12 NOTE — Progress Notes (Signed)
 "   Virtual Visit via Video   I connected with patient on 01/12/25 at 1330 by a video enabled telemedicine application and verified that I am speaking with the correct person using two identifiers.  Location patient: Home Location provider: [facility] Persons participating in the virtual visit: Patient and Provider  I discussed the limitations of evaluation and management by telemedicine and the availability of in person appointments. The patient expressed understanding and agreed to proceed.  Subjective:   HPI:  Pt presents today for  Chief Complaint  Patient presents with   Medication Reaction    Maria Lin is a 39 year old female who presents with severe nausea and vomiting after resuming Wegovy  post-hysterectomy.  She experienced severe nausea and vomiting after resuming Wegovy  following a break due to a hysterectomy in October. She stopped taking Wegovy  in the last week of September in preparation for her surgery on October 7th. After resuming the medication at a dose of 1.7 mg, she experienced intense vomiting, approximately 110 to 115 times, from Sunday at 1 PM until Monday night around 9:30 to 10 PM.  She is currently unable to eat and is subsisting on water , ginger ale, and crackers. She describes severe indigestion that makes her feel like she wants to regurgitate but is unable to. She has been taking Zofran  for nausea, but it is ineffective as she vomits it up within 20 minutes. For acid reflux, she has been using Tums and Prevacid.  She reports no pain in her stomach and does not feel dehydrated. She is unable to sleep for more than an hour at a time due to the need to eat a cracker to settle her stomach. She is not currently taking any other medications for her symptoms.       ROS per HPI   Patient Active Problem List   Diagnosis Date Noted   Pelvic pain 09/23/2024   Menorrhagia with regular cycle 09/23/2024   Family history of  breast cancer in female 03/25/2024   Breast cancer screening, high risk patient 03/25/2024   Changes in skin of both breasts 03/25/2024   BMI 40.0-44.9, adult (HCC) 03/25/2024   Generalized anxiety disorder 10/28/2018   Migraine 10/14/2018   Depression, recurrent 10/14/2018   Dysmenorrhea 10/14/2018   S/P cesarean section 01/26/2013    Social History   Tobacco Use   Smoking status: Former    Current packs/day: 0.10    Average packs/day: 0.1 packs/day for 19.0 years (1.9 ttl pk-yrs)    Types: Cigarettes   Smokeless tobacco: Never  Substance Use Topics   Alcohol use: Not Currently   Current Medications[1]  Allergies[2]  Objective:   Hysterectomy 09/23/2024  Patient is well-developed, well-nourished in no acute distress.  Resting comfortably at home.  Head is normocephalic, atraumatic.  No labored breathing.  Speech is clear and coherent with logical content.  Patient is alert and oriented at baseline.    Assessment and Plan:   Synia was seen today for medication reaction.  Diagnoses and all orders for this visit:  Nausea and vomiting in adult -     promethazine  (PHENERGAN ) 25 MG suppository; Place 1 suppository (25 mg total) rectally every 6 (six) hours as needed for nausea or vomiting.  Gastroesophageal reflux disease without esophagitis  Adverse effect of drug, initial encounter        Adverse effect of GLP-1 agonist therapy with severe nausea and vomiting Severe nausea and vomiting following resumption of Wegovy  at  1.7 mg after a break due to hysterectomy. Vomiting occurred over 110-115 times from Sunday to last night. Unable to tolerate oral intake except water , ginger ale, and crackers. Zofran  was ineffective as it was vomited up. No abdominal pain or dehydration reported. Likely due to resuming Wegovy  at a higher dose without titration. - Prescribed Phenergan  suppositories for nausea, to be inserted rectally. Advised to wait 25-30 minutes  post-administration to assess tolerance to oral intake. - Advised against driving or working while using Phenergan  due to potential drowsiness. - Plan to restart Wegovy  at a lower dose of 0.25 mg once nausea subsides. - Instructed to schedule an appointment for lab repeat and Wegovy  dose adjustment once symptoms improve. - Advised to contact if no relief by tomorrow evening.  Gastroesophageal reflux disease Severe indigestion and regurgitation symptoms. Currently using Tums and Prevacid for management. - Switch from Prevacid to Prilosec for better management of reflux symptoms. - Continue using Tums in conjunction with Prilosec for a few days to assess improvement.        Return if symptoms worsen or fail to improve.  Rosaline Bruns, FNP-C [facility] [address] [place], [place] [address] [phone]  01/12/2025  Time spent with the patient: 12 minutes, of which >50% was spent in obtaining information about symptoms, reviewing previous labs, evaluations, and treatments, counseling about condition (please see the discussed topics above), and developing a plan to further investigate it; had a number of questions which I addressed.      [1]  Current Outpatient Medications:    promethazine  (PHENERGAN ) 25 MG suppository, Place 1 suppository (25 mg total) rectally every 6 (six) hours as needed for nausea or vomiting., Disp: 12 each, Rfl: 0   acetaminophen  (TYLENOL ) 325 MG tablet, Take 2 tablets (650 mg total) by mouth every 6 (six) hours as needed., Disp: , Rfl:    b complex vitamins capsule, Take 1 capsule by mouth daily., Disp: , Rfl:    fluocinonide -emollient (LIDEX -E) 0.05 % cream, Apply 1 Application topically 2 (two) times daily. (Patient not taking: Reported on 11/09/2024), Disp: 15 g, Rfl: 1   gabapentin  (NEURONTIN ) 300 MG capsule, Take 1 capsule (300 mg total) by mouth 3 (three) times daily for 4 days. (Patient taking differently: Take 100 mg by mouth  daily as needed.), Disp: 12 capsule, Rfl: 0   ibuprofen  (ADVIL ) 600 MG tablet, Take 1 tablet (600 mg total) by mouth every 6 (six) hours as needed., Disp: 30 tablet, Rfl: 0   Multiple Vitamin (MULTIVITAMIN WITH MINERALS) TABS tablet, Take 1 tablet by mouth daily., Disp: , Rfl:    sertraline  (ZOLOFT ) 50 MG tablet, Take 1 tablet (50 mg total) by mouth daily. **NEEDS TO BE SEEN BEFORE NEXT REFILL**, Disp: 30 tablet, Rfl: 0   triamcinolone  ointment (KENALOG ) 0.5 %, Apply 1 Application topically 2 (two) times daily. (Patient not taking: Reported on 11/09/2024), Disp: 30 g, Rfl: 0 [2]  Allergies Allergen Reactions   Codeine Nausea And Vomiting   Penicillins Hives   Vicodin [Hydrocodone-Acetaminophen ] Nausea And Vomiting   "

## 2025-01-12 NOTE — Telephone Encounter (Signed)
 I called pt & made her an appt today for video w/Rakes.

## 2025-01-22 ENCOUNTER — Encounter: Payer: Self-pay | Admitting: *Deleted

## 2025-03-26 ENCOUNTER — Encounter: Payer: Self-pay | Admitting: Family Medicine

## 2025-04-09 ENCOUNTER — Ambulatory Visit
